# Patient Record
Sex: Female | Born: 1945 | Race: White | Hispanic: No | Marital: Married | Smoking: Never smoker
Health system: Southern US, Community
[De-identification: ages and names within clinical notes are randomized; demographics above are authoritative.]

## PROBLEM LIST (undated history)

## (undated) DIAGNOSIS — I2692 Saddle embolus of pulmonary artery without acute cor pulmonale: Secondary | ICD-10-CM

## (undated) DIAGNOSIS — C50912 Malignant neoplasm of unspecified site of left female breast: Secondary | ICD-10-CM

## (undated) DIAGNOSIS — D649 Anemia, unspecified: Secondary | ICD-10-CM

## (undated) DIAGNOSIS — I10 Essential (primary) hypertension: Secondary | ICD-10-CM

## (undated) DIAGNOSIS — C801 Malignant (primary) neoplasm, unspecified: Secondary | ICD-10-CM

## (undated) HISTORY — DX: Anemia, unspecified: D64.9

## (undated) HISTORY — DX: Malignant (primary) neoplasm, unspecified: C80.1

---
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
## 1972-04-18 HISTORY — PX: TUBAL LIGATION: SHX77

## 2005-04-18 DIAGNOSIS — C801 Malignant (primary) neoplasm, unspecified: Secondary | ICD-10-CM

## 2005-04-18 DIAGNOSIS — C50912 Malignant neoplasm of unspecified site of left female breast: Secondary | ICD-10-CM

## 2005-04-18 HISTORY — DX: Malignant (primary) neoplasm, unspecified: C80.1

## 2005-04-18 HISTORY — DX: Malignant neoplasm of unspecified site of left female breast: C50.912

## 2005-04-18 HISTORY — PX: BREAST BIOPSY: SHX20

## 2005-06-29 HISTORY — PX: BREAST LUMPECTOMY: SHX2

## 2014-07-15 LAB — LIPID PANEL
Cholesterol: 225 mg/dL — AB (ref 0–200)
HDL: 60 mg/dL (ref 35–70)
LDL Cholesterol: 124 mg/dL
Triglycerides: 203 mg/dL — AB (ref 40–160)

## 2014-07-15 LAB — BASIC METABOLIC PANEL
BUN: 17 mg/dL (ref 4–21)
CREATININE: 0.7 mg/dL (ref 0.5–1.1)
GLUCOSE: 90 mg/dL
POTASSIUM: 3.7 mmol/L (ref 3.4–5.3)
SODIUM: 142 mmol/L (ref 137–147)

## 2014-07-15 LAB — CBC AND DIFFERENTIAL
HCT: 44 % (ref 36–46)
HEMOGLOBIN: 14.6 g/dL (ref 12.0–16.0)
Platelets: 256 10*3/uL (ref 150–399)
WBC: 6 10*3/mL

## 2014-07-15 LAB — HEPATIC FUNCTION PANEL
ALT: 18 U/L (ref 7–35)
AST: 20 U/L (ref 13–35)
Alkaline Phosphatase: 100 U/L (ref 25–125)
BILIRUBIN, TOTAL: 0.4 mg/dL

## 2015-07-28 ENCOUNTER — Emergency Department (HOSPITAL_COMMUNITY): Payer: Medicare Other

## 2015-07-28 ENCOUNTER — Encounter (HOSPITAL_COMMUNITY): Payer: Self-pay | Admitting: *Deleted

## 2015-07-28 ENCOUNTER — Inpatient Hospital Stay (HOSPITAL_COMMUNITY): Payer: Medicare Other

## 2015-07-28 ENCOUNTER — Inpatient Hospital Stay (HOSPITAL_COMMUNITY)
Admission: EM | Admit: 2015-07-28 | Discharge: 2015-08-01 | DRG: 175 | Disposition: A | Discharge status: Home / Self Care | Payer: Medicare Other | Attending: Internal Medicine | Admitting: Internal Medicine

## 2015-07-28 DIAGNOSIS — Z9221 Personal history of antineoplastic chemotherapy: Secondary | ICD-10-CM

## 2015-07-28 DIAGNOSIS — I82412 Acute embolism and thrombosis of left femoral vein: Secondary | ICD-10-CM | POA: Diagnosis present

## 2015-07-28 DIAGNOSIS — IMO0002 Reserved for concepts with insufficient information to code with codable children: Secondary | ICD-10-CM

## 2015-07-28 DIAGNOSIS — R079 Chest pain, unspecified: Secondary | ICD-10-CM | POA: Diagnosis not present

## 2015-07-28 DIAGNOSIS — I82442 Acute embolism and thrombosis of left tibial vein: Secondary | ICD-10-CM | POA: Diagnosis present

## 2015-07-28 DIAGNOSIS — Z923 Personal history of irradiation: Secondary | ICD-10-CM

## 2015-07-28 DIAGNOSIS — J9601 Acute respiratory failure with hypoxia: Secondary | ICD-10-CM | POA: Diagnosis present

## 2015-07-28 DIAGNOSIS — R55 Syncope and collapse: Secondary | ICD-10-CM | POA: Diagnosis present

## 2015-07-28 DIAGNOSIS — Z853 Personal history of malignant neoplasm of breast: Secondary | ICD-10-CM

## 2015-07-28 DIAGNOSIS — I749 Embolism and thrombosis of unspecified artery: Secondary | ICD-10-CM | POA: Diagnosis not present

## 2015-07-28 DIAGNOSIS — I10 Essential (primary) hypertension: Secondary | ICD-10-CM | POA: Diagnosis present

## 2015-07-28 DIAGNOSIS — I517 Cardiomegaly: Secondary | ICD-10-CM | POA: Diagnosis present

## 2015-07-28 DIAGNOSIS — I119 Hypertensive heart disease without heart failure: Secondary | ICD-10-CM | POA: Diagnosis present

## 2015-07-28 DIAGNOSIS — I2699 Other pulmonary embolism without acute cor pulmonale: Secondary | ICD-10-CM | POA: Diagnosis present

## 2015-07-28 DIAGNOSIS — R0602 Shortness of breath: Secondary | ICD-10-CM | POA: Diagnosis not present

## 2015-07-28 DIAGNOSIS — I2692 Saddle embolus of pulmonary artery without acute cor pulmonale: Secondary | ICD-10-CM

## 2015-07-28 DIAGNOSIS — I82432 Acute embolism and thrombosis of left popliteal vein: Secondary | ICD-10-CM | POA: Diagnosis present

## 2015-07-28 DIAGNOSIS — I959 Hypotension, unspecified: Secondary | ICD-10-CM | POA: Diagnosis present

## 2015-07-28 HISTORY — DX: Malignant neoplasm of unspecified site of left female breast: C50.912

## 2015-07-28 HISTORY — DX: Saddle embolus of pulmonary artery without acute cor pulmonale: I26.92

## 2015-07-28 HISTORY — DX: Essential (primary) hypertension: I10

## 2015-07-28 LAB — COMPREHENSIVE METABOLIC PANEL
ALK PHOS: 78 U/L (ref 38–126)
ALT: 27 U/L (ref 14–54)
ALT: 37 U/L (ref 14–54)
ANION GAP: 11 (ref 5–15)
ANION GAP: 7 (ref 5–15)
AST: 37 U/L (ref 15–41)
AST: 48 U/L — ABNORMAL HIGH (ref 15–41)
Albumin: 2.7 g/dL — ABNORMAL LOW (ref 3.5–5.0)
Albumin: 2.6 g/dL — ABNORMAL LOW (ref 3.5–5.0)
Alkaline Phosphatase: 74 U/L (ref 38–126)
BILIRUBIN TOTAL: 0.4 mg/dL (ref 0.3–1.2)
BILIRUBIN TOTAL: 0.5 mg/dL (ref 0.3–1.2)
BUN: 10 mg/dL (ref 6–20)
BUN: 9 mg/dL (ref 6–20)
CALCIUM: 8.2 mg/dL — AB (ref 8.9–10.3)
CO2: 22 mmol/L (ref 22–32)
CO2: 22 mmol/L (ref 22–32)
Calcium: 8.1 mg/dL — ABNORMAL LOW (ref 8.9–10.3)
Chloride: 109 mmol/L (ref 101–111)
Chloride: 112 mmol/L — ABNORMAL HIGH (ref 101–111)
Creatinine, Ser: 0.84 mg/dL (ref 0.44–1.00)
Creatinine, Ser: 0.72 mg/dL (ref 0.44–1.00)
GFR calc non Af Amer: >60 mL/min (ref >60)
Glucose, Bld: 101 mg/dL — ABNORMAL HIGH (ref 65–99)
Glucose, Bld: 113 mg/dL — ABNORMAL HIGH (ref 65–99)
POTASSIUM: 3.7 mmol/L (ref 3.5–5.1)
Potassium: 3.9 mmol/L (ref 3.5–5.1)
SODIUM: 141 mmol/L (ref 135–145)
Sodium: 142 mmol/L (ref 135–145)
TOTAL PROTEIN: 5.7 g/dL — AB (ref 6.5–8.1)
TOTAL PROTEIN: 5.4 g/dL — AB (ref 6.5–8.1)

## 2015-07-28 LAB — CBC
HEMATOCRIT: 30.6 % — AB (ref 36.0–46.0)
Hemoglobin: 10 g/dL — ABNORMAL LOW (ref 12.0–15.0)
MCH: 29.9 pg (ref 26.0–34.0)
MCHC: 32.7 g/dL (ref 30.0–36.0)
MCV: 91.6 fL (ref 78.0–100.0)
PLATELETS: 155 10*3/uL (ref 150–400)
RBC: 3.34 MIL/uL — AB (ref 3.87–5.11)
RDW: 12.8 % (ref 11.5–15.5)
WBC: 10.2 10*3/uL (ref 4.0–10.5)

## 2015-07-28 LAB — CBC WITH DIFFERENTIAL/PLATELET
BASOS ABS: 0 10*3/uL (ref 0.0–0.1)
Basophils Absolute: 0 10*3/uL (ref 0.0–0.1)
Basophils Relative: 0 %
Basophils Relative: 0 %
EOS ABS: 0 10*3/uL (ref 0.0–0.7)
EOS PCT: 1 %
Eosinophils Absolute: 0.1 10*3/uL (ref 0.0–0.7)
Eosinophils Relative: 0 %
HEMATOCRIT: 31.1 % — AB (ref 36.0–46.0)
HEMATOCRIT: 29.6 % — AB (ref 36.0–46.0)
HEMOGLOBIN: 9.2 g/dL — AB (ref 12.0–15.0)
Hemoglobin: 9.7 g/dL — ABNORMAL LOW (ref 12.0–15.0)
LYMPHS ABS: 1.4 10*3/uL (ref 0.7–4.0)
LYMPHS PCT: 13 %
Lymphocytes Relative: 20 %
Lymphs Abs: 1.6 10*3/uL (ref 0.7–4.0)
MCH: 28.8 pg (ref 26.0–34.0)
MCH: 28.4 pg (ref 26.0–34.0)
MCHC: 31.2 g/dL (ref 30.0–36.0)
MCHC: 31.1 g/dL (ref 30.0–36.0)
MCV: 92.3 fL (ref 78.0–100.0)
MCV: 91.4 fL (ref 78.0–100.0)
MONO ABS: 0.8 10*3/uL (ref 0.1–1.0)
MONO ABS: 0.4 10*3/uL (ref 0.1–1.0)
MONOS PCT: 6 %
MONOS PCT: 6 %
Neutro Abs: 10 10*3/uL — ABNORMAL HIGH (ref 1.7–7.7)
Neutro Abs: 5.1 10*3/uL (ref 1.7–7.7)
Neutrophils Relative %: 80 %
Neutrophils Relative %: 74 %
PLATELETS: 144 10*3/uL — AB (ref 150–400)
Platelets: 152 10*3/uL (ref 150–400)
RBC: 3.37 MIL/uL — ABNORMAL LOW (ref 3.87–5.11)
RBC: 3.24 MIL/uL — ABNORMAL LOW (ref 3.87–5.11)
RDW: 12.9 % (ref 11.5–15.5)
RDW: 12.8 % (ref 11.5–15.5)
WBC: 12.5 10*3/uL — ABNORMAL HIGH (ref 4.0–10.5)
WBC: 6.9 10*3/uL (ref 4.0–10.5)

## 2015-07-28 LAB — I-STAT CHEM 8, ED
BUN: 13 mg/dL (ref 6–20)
CALCIUM ION: 1.15 mmol/L (ref 1.13–1.30)
CHLORIDE: 106 mmol/L (ref 101–111)
Creatinine, Ser: 0.7 mg/dL (ref 0.44–1.00)
GLUCOSE: 93 mg/dL (ref 65–99)
HCT: 33 % — ABNORMAL LOW (ref 36.0–46.0)
Hemoglobin: 11.2 g/dL — ABNORMAL LOW (ref 12.0–15.0)
Potassium: 3.6 mmol/L (ref 3.5–5.1)
Sodium: 143 mmol/L (ref 135–145)
TCO2: 24 mmol/L (ref 0–100)

## 2015-07-28 LAB — ANTITHROMBIN III: AntiThromb III Func: 82 % (ref 75–120)

## 2015-07-28 LAB — PROTIME-INR
INR: 1.28 (ref 0.00–1.49)
INR: 1.28 (ref 0.00–1.49)
Prothrombin Time: 16.2 seconds — ABNORMAL HIGH (ref 11.6–15.2)
Prothrombin Time: 16.1 seconds — ABNORMAL HIGH (ref 11.6–15.2)

## 2015-07-28 LAB — I-STAT TROPONIN, ED: TROPONIN I, POC: 0.16 ng/mL — AB (ref 0.00–0.08)

## 2015-07-28 LAB — APTT
aPTT: 27 seconds (ref 24–37)
aPTT: 31 seconds (ref 24–37)

## 2015-07-28 LAB — LACTIC ACID, PLASMA: LACTIC ACID, VENOUS: 1.1 mmol/L (ref 0.5–2.0)

## 2015-07-28 MED ORDER — HEPARIN (PORCINE) IN NACL 100-0.45 UNIT/ML-% IJ SOLN
1000.0000 [IU]/h | INTRAMUSCULAR | Status: DC
  Administered 2015-07-28: 1000 [IU]/h via INTRAVENOUS
  Filled 2015-07-28 (×2): qty 250

## 2015-07-28 MED ORDER — PANTOPRAZOLE SODIUM 40 MG PO TBEC
40.0000 mg | DELAYED_RELEASE_TABLET | Freq: Every day | ORAL | Status: DC
  Administered 2015-07-29 – 2015-08-01 (×4): 40 mg via ORAL
  Filled 2015-07-28 (×3): qty 1

## 2015-07-28 MED ORDER — HEPARIN BOLUS VIA INFUSION
4000.0000 [IU] | Freq: Once | INTRAVENOUS | Status: DC
  Filled 2015-07-28: qty 4000

## 2015-07-28 MED ORDER — HEPARIN (PORCINE) IN NACL 100-0.45 UNIT/ML-% IJ SOLN
1100.0000 [IU]/h | INTRAMUSCULAR | Status: DC
  Filled 2015-07-28: qty 250

## 2015-07-28 MED ORDER — ALTEPLASE (PULMONARY EMBOLISM) INFUSION: 100.0000 mg | Freq: Once | INTRAVENOUS | Status: DC

## 2015-07-28 MED ORDER — IOPAMIDOL (ISOVUE-370) INJECTION 76%
INTRAVENOUS | Status: AC
  Administered 2015-07-28: 100 mL
  Filled 2015-07-28: qty 100

## 2015-07-28 MED ORDER — SODIUM CHLORIDE 0.9 % IV SOLN
Freq: Once | INTRAVENOUS | Status: AC
  Administered 2015-07-28: 17:00:00 via INTRAVENOUS

## 2015-07-28 MED ORDER — SODIUM CHLORIDE 0.9 % IV SOLN
INTRAVENOUS | Status: DC
  Administered 2015-07-29 – 2015-07-31 (×4): via INTRAVENOUS

## 2015-07-28 MED ORDER — SODIUM CHLORIDE 0.9 % IV BOLUS (SEPSIS)
1000.0000 mL | Freq: Once | INTRAVENOUS | Status: AC
  Administered 2015-07-28: 1000 mL via INTRAVENOUS

## 2015-07-28 MED ORDER — ALTEPLASE (PULMONARY EMBOLISM) INFUSION
100.0000 mg | Freq: Once | INTRAVENOUS | Status: AC
  Administered 2015-07-28: 100 mg via INTRAVENOUS
  Filled 2015-07-28: qty 100

## 2015-07-28 NOTE — ED Notes (Signed)
Dr. Darl Householder at bedside with patient and family.  Ultrasound at bedside.

## 2015-07-28 NOTE — ED Notes (Signed)
Critical care at bedside with patient and family.

## 2015-07-28 NOTE — Progress Notes (Signed)
Patient arrived on unit from ED.  Transferred to unit bed from stretcher.  Patient calm and pleasant.  No s/s of distress or pain.  Patient alert and oriented x4.  Equal strength in BLE BUE.  Neuro assessment WNL.

## 2015-07-28 NOTE — ED Notes (Signed)
Nurse transported patient to CT  

## 2015-07-28 NOTE — Progress Notes (Signed)
Patients PIV's do not have blood return.  Reported to Georgann Housekeeper, NP.  Stated its O.K. for lab to stick her for blood.

## 2015-07-28 NOTE — ED Notes (Signed)
Additional information patient became dizzy after watering plants fell hit forehead on floor. Hematoma present light pink red. EMS reported last vital signs of BP 90/50, HR 78, RR16, and CBG 133.

## 2015-07-28 NOTE — H&P (Signed)
Name: Peggy Cuevas MRN: [id number] DOB: 05-25-1945    ADMISSION DATE:  07/28/2015 CONSULTATION DATE:  07/28/2015  REFERRING MD :    CHIEF COMPLAINT:  Dizziness  BRIEF PATIENT DESCRIPTION: 70 year old female presented to ED 4/11 after syncopal episode found to have saddle PE. Requiring 3L [place] for sat > 92%. PCCM to admit.  SIGNIFICANT EVENTS  4/11 admit  STUDIES:  CT chest 4/11>Positive  for acute PE with saddle embolus and CT evidence of right heart strain consistent  HISTORY OF PRESENT ILLNESS:  70 year old female with PMH as below, which includes HTN. She does not smoke/drink alcohol/or use any illegal drugs. She travels by car quite frequently for several hours at a time. Approximately one week prior to admission she started having some L leg discomfort and swelling. She wrote this off and did not have time to get it addressed. 4/11 after getting home she sat down in a chair and became dizzy eventually suffering a syncopal episode and falling out of chair, striking R frontal head on marble floor. Husband quickly called 911 and she was transported to ED, where she was initially hypoxic and hypotensive. Which responded well to supplemental O2 and IV fluid. CT angio of the chest demonstrated saddle PE. She was initially started on TPA in ED, however, she improved quickly and TPA was stopped shortly after (about 20 mins). She was admitted to ICU for further monitoring.   PAST MEDICAL HISTORY :   has a past medical history of Hypertension.  has no past surgical history on file. Prior to Admission medications   Not on File   No Known Allergies  FAMILY HISTORY:  family history is not on file. SOCIAL HISTORY:  reports that she has never smoked. She does not have any smokeless tobacco history on file. She reports that she does not drink alcohol or use illicit drugs.  REVIEW OF SYSTEMS:   Bolds are positive  Constitutional: weight loss, gain, night sweats, Fevers, chills, fatigue .    HEENT: headaches, Sore throat, sneezing, nasal congestion, post nasal drip, Difficulty swallowing, Tooth/dental problems, visual complaints visual changes, ear ache CV:  chest pain, radiates: ,Orthopnea, PND, swelling in lower extremities, dizziness, palpitations, syncope.  GI  heartburn, indigestion, abdominal pain, nausea, vomiting, diarrhea, change in bowel habits, loss of appetite, bloody stools.  Resp: cough, productive: , hemoptysis, dyspnea, chest pain, pleuritic.  Skin: rash or itching or icterus GU: dysuria, change in color of urine, urgency or frequency. flank pain, hematuria  MS: L leg pain or swelling. decreased range of motion  Psych: change in mood or affect. depression or anxiety.  Neuro: difficulty with speech, weakness, numbness, ataxia    SUBJECTIVE:   VITAL SIGNS: Temp:  [97.6 F (36.4 C)] 97.6 F (36.4 C) (04/11 1630) Pulse Rate:  [87-96] 96 (04/11 1645) Resp:  [20-22] 20 (04/11 1630) BP: (90-121)/(66-88) 121/82 mmHg (04/11 1645) SpO2:  [85 %-100 %] 100 % (04/11 1645) Weight:  [71.668 kg (158 lb)] 71.668 kg (158 lb) (04/11 1603)  PHYSICAL EXAMINATION: General:  Female of normal body habitus in NAD Neuro:  Alert, oriented, non-focal HEENT:  Moderate hematoma above R eye. PERRL, non-focal  Cardiovascular:  RRR, no MRG Lungs:  Clear bilateral breath sounds, normal effort Abdomen:  Soft, non-tender, non-distended Musculoskeletal:  +1 pitting edema LLE, with erythema on anterior surface Skin:  Grossly intact   Recent Labs Lab 07/28/15 1521 07/28/15 1524  NA 143 142  K 3.6 3.7  CL 106 109  CO2  --  22  BUN 13 10  CREATININE 0.70 0.84  GLUCOSE 93 101*    Recent Labs Lab 07/28/15 1521 07/28/15 1524 07/28/15 1626  HGB 11.2* 9.7* 10.0*  HCT 33.0* 31.1* 30.6*  WBC  --  12.5* 10.2  PLT  --  144* 155   Ct Angio Chest Pe W/cm &/or Wo Cm  07/28/2015  CLINICAL DATA:  70 year old female with syncope. Initial encounter. EXAM: CT ANGIOGRAPHY CHEST WITH  CONTRAST TECHNIQUE: Multidetector CT imaging of the chest was performed using the standard protocol during bolus administration of intravenous contrast. Multiplanar CT image reconstructions and MIPs were obtained to evaluate the vascular anatomy. CONTRAST:  100 mL Isovue 370 COMPARISON:  None available FINDINGS: Good contrast bolus timing in the pulmonary arterial tree. Positive for saddle pulmonary embolus. Moderate to large volume of hilar thrombus bilaterally, slightly greater on the right. Axial images suggest an abnormal RV LV ratio (Series 6, image 289). No pericardial effusion. Superimposed moderate to large gastric hiatal hernia. No pleural effusions. No mediastinal lymphadenopathy. Calcified aortic atherosclerosis. Calcified coronary artery atherosclerosis. No axillary lymphadenopathy. Major airways are patent aside from atelectatic changes. Mild lower lobe ground-glass opacity, favor atelectasis. No consolidation or definite pulmonary infarct at this time. No acute osseous abnormality identified. CT Abdomen and Pelvis from today reported separately. Visualized noncontrast liver, gallbladder, spleen, pancreas, adrenal glands, kidneys, and small bowel are within normal limits. Diverticulosis of the colon. Gastric hiatal hernia as described above. Review of the MIP images confirms the above findings. IMPRESSION: 1. Positive for acute PE with saddle embolus and CT evidence of right heart strain consistent with at least submassive (intermediate risk) PE. The presence of right heart strain has been associated with an increased risk of morbidity and mortality. Please activate Code PE by paging [phone]. Critical Value/emergent results discussed in person with Dr. Shirlyn Goltz at 1605 hours on 07/28/2015. 2. No other acute findings identified in the chest or visualized upper abdomen. Moderate to large gastric hiatal hernia. Electronically Signed   By: Genevie Ann M.D.   On: 07/28/2015 16:46   Dg Chest Port 1  View  07/28/2015  CLINICAL DATA:  70 year old female with syncope. Acute pulmonary embolus. Initial encounter. EXAM: PORTABLE CHEST 1 VIEW COMPARISON:  Chest CTA from today reported separately. FINDINGS: Portable AP semi upright view at 1603 hours. Mild cardiomegaly. Gastric hiatal hernia, better demonstrated by CTA. Other mediastinal contours are within normal limits. Somewhat low lung volumes. No pneumothorax, pulmonary edema, pleural effusion or consolidation. Visualized tracheal air column is within normal limits. IMPRESSION: 1. Acute pulmonary embolus, see chest CTA reported separately. 2. No superimposed acute acute radiographic findings. Electronically Signed   By: Genevie Ann M.D.   On: 07/28/2015 16:52    ASSESSMENT / PLAN:  Acute hypoxemic respiratory failure Pulmonary embolism (saddle) Syncope, suspect secondary to PE Hypotension > improved - Heparin gtt per pharmacy once aPTT at safe level to intitiate.  - Doppler legs to assess DVT - Echocardiogram to assess R heart strain - Patient educated on warfarin vs NOAC, will likely need 6 months anticoagulation.  - Hypercoagulable panel - NS 50 mL/hour - Monitor for signs of bleeding  H/o Hypertension - holding home losartan  Diet: Regular SUP: PO protonix VTE ppx: on full dose anticoagulation Admit: ICU  Georgann Housekeeper, AGACNP-BC [facility] Pulmonology/Critical Care Pager [phone] or [phone]  07/28/2015 5:32 PM  Attending Note:  70 year old female with PMH only of HTN on losartan who had left  leg pain and swelling after a long car ride.  Patient arrived home and sat on a chair then had a syncopal episode, fell and struck her head.  Patient was brought to the ED where she had a CT of the chest that revealed a saddle embolism, was hypoxemic and hypotensive (responded to IVF) and PCCM was consulted for admission.  Decision initially was made to give tPA however after exam, tPA was stopped due to head injury and lack of  refractory hypoxemia or refractory hypotension.  On exam, a 5x5 hematoma on the right aspect of the skin and chest exam is clear.  Patient received a total of 18 mg of tPA before it stopping.  Discussed with pharmacy.  Will check a PTT level now, if <16 will start heparin.  Will admit to the ICU for observation.  Patient given the choice of coumadin or a NOAC and is currently considering it.  The patient is critically ill with multiple organ systems failure and requires high complexity decision making for assessment and support, frequent evaluation and titration of therapies, application of advanced monitoring technologies and extensive interpretation of multiple databases.   Critical Care Time devoted to patient care services described in this note is  35  Minutes. This time reflects time of care of this signee Dr Jennet Maduro. This critical care time does not reflect procedure time, or teaching time or supervisory time of PA/NP/Med student/Med Resident etc but could involve care discussion time.  Rush Farmer, M.D. [facility] Pulmonary/Critical Care Medicine. Pager: [phone]. After hours pager: [phone].

## 2015-07-28 NOTE — ED Notes (Signed)
EKG completed given to EDP.  

## 2015-07-28 NOTE — Progress Notes (Signed)
ANTICOAGULATION CONSULT NOTE - Initial Consult  Pharmacy Consult for Heparin Indication: pulmonary embolus  No Known Allergies  Patient Measurements: Height: 5\' 4"  (162.6 cm) Weight: 158 lb (71.668 kg) IBW/kg (Calculated) : 54.7 Heparin Dosing Weight: 69.4 kg  Vital Signs: BP: 114/88 mmHg (04/11 1530) Pulse Rate: 94 (04/11 1530)  Labs:  Recent Labs  07/28/15 1521 07/28/15 1524  HGB 11.2* 9.7*  HCT 33.0* 31.1*  PLT  --  144*  CREATININE 0.70 0.84    Estimated Creatinine Clearance: 62.2 mL/min (by C-G formula based on Cr of 0.84).   Medical History: Past Medical History  Diagnosis Date  . Hypertension     Assessment: 24-yo female presenting after syncopal event.  Described lower left extremity swelling for one week, then drove from [place].  Patient reports not oral anticoagulant use PTA.  CT reveals massive, saddle PE.  Started tPA 100 mg at 1631, but stopped after 15-20 mins due to hematoma from syncopal fall.  Pharmacy consulted to start heparin.    On Admit: Hgb 9.7, PLT 144, Trop 0.16  Goal of Therapy:  Heparin level 0.3-0.5 units/ml for 24h, then 0.3-0.7 Monitor platelets by anticoagulation protocol: Yes   Plan:  --When aPTT < 80, start Heparin 1000 units/hr --Check anti-Xa level in 8 hours and daily while on heparin --Continue to monitor H&H and platelets  Viann Fish 07/28/2015,4:04 PM

## 2015-07-28 NOTE — ED Provider Notes (Signed)
CSN: [id number]     Arrival date & time 07/28/15  1453 History   First MD Initiated Contact with Patient 07/28/15 1454     Chief Complaint  Patient presents with  . Loss of Consciousness  . Hypotension     (Consider location/radiation/quality/duration/timing/severity/associated sxs/prior Treatment) The history is provided by the patient and the EMS personnel.  Peggy Cuevas is a 70 y.o. female hx of HTN on losartan here with syncope. Patient states that she was driving all around the last week or so. She went up to rocking him then went to [place]. Got home yesterday. Patient notice progressive left leg swelling for the last week. Today she was watering her plants and then sending felt lightheaded and dizzy and then passed out and hit her head on the floor. EMS noticed hematoma on the right forehead. She was also noticed to have thready pulse and was hypotensive to the 60s-70s initially. Given 1500 cc by EMS and BP 90s on arrival. Per EMS she was hypoxic as well.    Level V caveat- condition of patient    Past Medical History  Diagnosis Date  . Hypertension    History reviewed. No pertinent past surgical history. No family history on file. Social History  Substance Use Topics  . Smoking status: Never Smoker   . Smokeless tobacco: None  . Alcohol Use: No   OB History    No data available     Review of Systems  Cardiovascular: Positive for syncope.  Musculoskeletal:       L calf pain   Neurological: Positive for syncope.  All other systems reviewed and are negative.     Allergies  Review of patient's allergies indicates no known allergies.  Home Medications   Prior to Admission medications   Not on File   BP 117/77 mmHg  Pulse 94  Resp 20  Ht 5\' 4"  (1.626 m)  Wt 158 lb (71.668 kg)  BMI 27.11 kg/m2  SpO2 100% Physical Exam  Constitutional: She is oriented to person, place, and time.  Ill appearing, pale   HENT:  Head: Normocephalic.  Mouth/Throat:  Oropharynx is clear and moist.  Forehead hematoma, no obvious bleeding or laceration   Eyes: Conjunctivae are normal. Pupils are equal, round, and reactive to light.  Neck: Normal range of motion. Neck supple.  Cardiovascular: Normal rate, regular rhythm and normal heart sounds.   Pulmonary/Chest: Effort normal and breath sounds normal. No respiratory distress. She has no wheezes. She has no rales.  Abdominal: Soft. Bowel sounds are normal. She exhibits no distension. There is no tenderness. There is no rebound.  Musculoskeletal:  L calf swollen, tender. 2+ pulses all extremities   Neurological: She is alert and oriented to person, place, and time. No cranial nerve deficit. Coordination normal.  Skin: Skin is warm and dry.  Psychiatric: She has a normal mood and affect. Her behavior is normal. Judgment and thought content normal.  Nursing note and vitals reviewed.   ED Course  Procedures (including critical care time)  CRITICAL CARE Performed by: Darl Householder, Lukka Black   Total critical care time: 45 minutes  Critical care time was exclusive of separately billable procedures and treating other patients.  Critical care was necessary to treat or prevent imminent or life-threatening deterioration.  Critical care was time spent personally by me on the following activities: development of treatment plan with patient and/or surrogate as well as nursing, discussions with consultants, evaluation of patient's response to treatment, examination of patient, obtaining  history from patient or surrogate, ordering and performing treatments and interventions, ordering and review of laboratory studies, ordering and review of radiographic studies, pulse oximetry and re-evaluation of patient's condition.     EMERGENCY DEPARTMENT [place] CARDIAC EXAM "Study: Limited Ultrasound of the heart and pericardium"  INDICATIONS:Hypotension Multiple views of the heart and pericardium are obtained with a multi-frequency  probe.  PERFORMED [id number]  IMAGES ARCHIVED?: No  FINDINGS: No pericardial effusion and Normal contractility  LIMITATIONS:  Body habitus  VIEWS USED: Subcostal 4 chamber and Apical 4 chamber   INTERPRETATION: Cardiac activity present  COMMENT:  RV dilated   EMERGENCY DEPARTMENT [place] ABD/AORTA EXAM Study: Limited Ultrasound of the Abdominal Aorta.  INDICATIONS:Hypotension Indication: Multiple views of the abdominal aorta are obtained from the diaphragmatic hiatus to the aortic bifurcation in transverse and sagittal planes with a multi- Frequency probe.  PERFORMED BY: Myself  IMAGES ARCHIVED?: No  FINDINGS: Free fluid absent  LIMITATIONS:  Body habitus  INTERPRETATION:  No abdominal aortic aneurysm  COMMENT:  No obvious AAA    Labs Review Labs Reviewed  CBC WITH DIFFERENTIAL/PLATELET - Abnormal; Notable for the following:    WBC 12.5 (*)    RBC 3.37 (*)    Hemoglobin 9.7 (*)    HCT 31.1 (*)    Platelets 144 (*)    Neutro Abs 10.0 (*)    All other components within normal limits  COMPREHENSIVE METABOLIC PANEL - Abnormal; Notable for the following:    Glucose, Bld 101 (*)    Calcium 8.1 (*)    Total Protein 5.7 (*)    Albumin 2.7 (*)    All other components within normal limits  I-STAT CHEM 8, ED - Abnormal; Notable for the following:    Hemoglobin 11.2 (*)    HCT 33.0 (*)    All other components within normal limits  I-STAT TROPOININ, ED - Abnormal; Notable for the following:    Troponin i, poc 0.16 (*)    All other components within normal limits  APTT  PROTIME-INR  CBC  APTT  CBC  PROTIME-INR    Imaging Review No results found. I have personally reviewed and evaluated these images and lab results as part of my medical decision-making.   EKG Interpretation   Date/Time:  Tuesday July 28 2015 14:53:54 EDT Ventricular Rate:  85 PR Interval:  149 QRS Duration: 98 QT Interval:  457 QTC Calculation: 543 R Axis:   100 Text Interpretation:  Sinus  rhythm Right axis deviation Prolonged QT  interval No previous ECGs available Confirmed by Ariauna Farabee  MD, Wilver Tignor ([id number]) on  07/28/2015 4:28:01 PM      MDM   Final diagnoses:  None   Peggy Cuevas is a 70 y.o. female here with syncope, hypotension, hypoxia. Concerned for large PE vs AAA but has no abdominal tenderness or pulsatile mass. Bedside US showed dilated RV and no obvious AAA. Will get CT head/neck due to fall, CT angio chest and ab/pel to r/o PE or abdominal pathology.   3:40 pm CT showed massive PE with saddle embolus with R heart strain. Trop positive likely from demand ischemia. BP slightly improved to low 100s after 1 L NS. O2 100% on nonrebreather. Still mentating well. CT head showed no bleed. Discussed with Dr. Ashby Dawes from critical care. He recommend TPA. Ordered TPA and heparin, pharmacy to dose. Will admit to critical care. Still protecting airway, doesn't require intubation currently.    Wandra Arthurs, MD 07/28/15 [id number]

## 2015-07-28 NOTE — ED Notes (Signed)
Returned to ED from CT.  ?

## 2015-07-28 NOTE — Progress Notes (Addendum)
[facility] Progress Note Patient Name: Peggy Cuevas DOB: March 22, 1946 MRN: [id number]   Date of Service  07/28/2015  HPI/Events of Note  Discussed case with ED physician, pt admitted with saddle embolism, hypotension responsive to IVF. PACS system down therefore I could not review the films personally, however they were reviewed with radiologist by ED. Pt has progressive hypoxia initially RA>>and is now on 100% NRB.   eICU Interventions  Recommend thrombolytics. Will admit to ICU.         Laverle Hobby 07/28/2015, 4:09 PM

## 2015-07-28 NOTE — ED Notes (Signed)
One week ago developed swelling left lower extremity and drove home today from [place]. When arrived home watered plants and then had a syncope event. EMS called could only obtain a carotid pulse administered 0.9 NS 1500ML upon arrival patient alert no complaints BP systolic [id number] per EMS. Doctor notified and at bedside upon arrival.

## 2015-07-28 NOTE — ED Notes (Signed)
Patient on room air for about 2 minutes, drops down to 89%.  Patient placed on 2L nasal cannula with 95%.

## 2015-07-29 ENCOUNTER — Inpatient Hospital Stay (HOSPITAL_COMMUNITY): Payer: Medicare Other

## 2015-07-29 ENCOUNTER — Encounter (HOSPITAL_COMMUNITY): Payer: Self-pay | Admitting: General Practice

## 2015-07-29 DIAGNOSIS — R0602 Shortness of breath: Secondary | ICD-10-CM

## 2015-07-29 DIAGNOSIS — R079 Chest pain, unspecified: Secondary | ICD-10-CM

## 2015-07-29 LAB — BASIC METABOLIC PANEL
Anion gap: 9 (ref 5–15)
BUN: 8 mg/dL (ref 6–20)
CALCIUM: 8.3 mg/dL — AB (ref 8.9–10.3)
CO2: 23 mmol/L (ref 22–32)
CREATININE: 0.84 mg/dL (ref 0.44–1.00)
Chloride: 112 mmol/L — ABNORMAL HIGH (ref 101–111)
GFR calc Af Amer: >60 mL/min (ref >60)
GLUCOSE: 92 mg/dL (ref 65–99)
Potassium: 3.8 mmol/L (ref 3.5–5.1)
Sodium: 144 mmol/L (ref 135–145)

## 2015-07-29 LAB — LUPUS ANTICOAGULANT PANEL
DRVVT: 40.2 s (ref 0.0–44.0)
PTT LA: 35.9 s (ref 0.0–43.6)

## 2015-07-29 LAB — HEPARIN LEVEL (UNFRACTIONATED)
Heparin Unfractionated: 0.37 IU/mL (ref 0.30–0.70)
Heparin Unfractionated: 0.52 IU/mL (ref 0.30–0.70)

## 2015-07-29 LAB — ECHOCARDIOGRAM COMPLETE
Height: 64 in
Weight: 2528 oz

## 2015-07-29 LAB — MAGNESIUM: Magnesium: 1.9 mg/dL (ref 1.7–2.4)

## 2015-07-29 LAB — BETA-2-GLYCOPROTEIN I ABS, IGG/M/A
Beta-2 Glyco I IgG: <9 GPI IgG units (ref 0–20)
Beta-2-Glycoprotein I IgA: <9 GPI IgA units (ref 0–25)
Beta-2-Glycoprotein I IgM: <9 GPI IgM units (ref 0–32)

## 2015-07-29 LAB — PROTEIN C ACTIVITY: PROTEIN C ACTIVITY: 105 % (ref 73–180)

## 2015-07-29 LAB — CBC
HEMATOCRIT: 30.1 % — AB (ref 36.0–46.0)
Hemoglobin: 9.3 g/dL — ABNORMAL LOW (ref 12.0–15.0)
MCH: 28.4 pg (ref 26.0–34.0)
MCHC: 30.9 g/dL (ref 30.0–36.0)
MCV: 91.8 fL (ref 78.0–100.0)
PLATELETS: 159 10*3/uL (ref 150–400)
RBC: 3.28 MIL/uL — ABNORMAL LOW (ref 3.87–5.11)
RDW: 13 % (ref 11.5–15.5)
WBC: 6 10*3/uL (ref 4.0–10.5)

## 2015-07-29 LAB — PROTEIN S ACTIVITY: PROTEIN S ACTIVITY: 85 % (ref 63–140)

## 2015-07-29 LAB — HOMOCYSTEINE: Homocysteine: 8.3 umol/L (ref 0.0–15.0)

## 2015-07-29 LAB — PROTEIN C, TOTAL: PROTEIN C, TOTAL: 65 % (ref 60–150)

## 2015-07-29 LAB — MRSA PCR SCREENING: MRSA BY PCR: NEGATIVE

## 2015-07-29 LAB — PHOSPHORUS: Phosphorus: 3.1 mg/dL (ref 2.5–4.6)

## 2015-07-29 LAB — PROTEIN S, TOTAL: PROTEIN S AG TOTAL: 148 % (ref 60–150)

## 2015-07-29 MED ORDER — LOSARTAN POTASSIUM 50 MG PO TABS
100.0000 mg | ORAL_TABLET | Freq: Every day | ORAL | Status: DC
  Administered 2015-07-30 – 2015-08-01 (×3): 100 mg via ORAL
  Filled 2015-07-29 (×3): qty 2

## 2015-07-29 MED ORDER — HEPARIN (PORCINE) IN NACL 100-0.45 UNIT/ML-% IJ SOLN
800.0000 [IU]/h | INTRAMUSCULAR | Status: DC
  Administered 2015-07-29: 1000 [IU]/h via INTRAVENOUS
  Filled 2015-07-29: qty 250

## 2015-07-29 NOTE — Progress Notes (Signed)
  Echocardiogram 2D Echocardiogram has been performed.  Peggy Cuevas 07/29/2015, 12:28 PM

## 2015-07-29 NOTE — Progress Notes (Signed)
*  PRELIMINARY RESULTS* Vascular Ultrasound Lower extremity venous duplex has been completed.  Preliminary findings: DVT noted in the Left common femoral, femoral, profunda femoral, popliteal, gastroc, soleal, posterior tibial, and peroneal veins. No DVT RLE.   Landry Mellow, RDMS, RVT  07/29/2015, 1:45 PM

## 2015-07-29 NOTE — Progress Notes (Signed)
ANTICOAGULATION CONSULT NOTE - Follow Up Consult  Pharmacy Consult for Heparin  Indication: pulmonary embolus  No Known Allergies  Patient Measurements: Height: 5\' 4"  (162.6 cm) Weight: 158 lb (71.668 kg) IBW/kg (Calculated) : 54.7  Vital Signs: Temp: 97.6 F (36.4 C) (04/12 0408) Temp Source: Oral (04/12 0408) BP: 114/78 mmHg (04/12 0500) Pulse Rate: 78 (04/12 0500)  Labs:  Recent Labs  07/28/15 1521  07/28/15 1524 07/28/15 1626 07/28/15 1925 07/29/15 0455  HGB 11.2*  --  9.7* 10.0* 9.2* 9.3*  HCT 33.0*  --  31.1* 30.6* 29.6* 30.1*  PLT  --   < > 144* 155 152 159  APTT  --   --   --  27 31  --   LABPROT  --   --   --  16.2* 16.1*  --   INR  --   --   --  1.28 1.28  --   HEPARINUNFRC  --   --   --   --   --  0.37  CREATININE 0.70  --  0.84  --  0.72  --   < > = values in this interval not displayed.  Estimated Creatinine Clearance: 65.3 mL/min (by C-G formula based on Cr of 0.72).  Assessment: Heparin for saddle PE, initial heparin level is therapeutic at 0.37, other labs reviewed.   Goal of Therapy:  Heparin level 0.3-0.7 units/ml Monitor platelets by anticoagulation protocol: Yes   Plan:  -Cont heparin at 1000 units/hr -1200 HL  Narda Bonds 07/29/2015,6:00 AM

## 2015-07-29 NOTE — Progress Notes (Signed)
PULMONARY / CRITICAL CARE MEDICINE   Name: Peggy Cuevas MRN: [id number] DOB: 1945-06-20    ADMISSION DATE:  07/28/2015 CONSULTATION DATE:  07/28/2015  REFERRING MD: EDP  CHIEF COMPLAINT:  Dizziness  HISTORY OF PRESENT ILLNESS:   70 year old female with PMH as below, which includes HTN. She does not smoke/drink alcohol/or use any illegal drugs. She travels by car quite frequently for several hours at a time. Approximately one week prior to admission she started having some L leg discomfort and swelling. She wrote this off and did not have time to get it addressed. 4/11 after getting home she sat down in a chair and became dizzy eventually suffering a syncopal episode and falling out of chair, striking R frontal head on marble floor. Husband quickly called 911 and she was transported to ED, where she was initially hypoxic and hypotensive. Which responded well to supplemental O2 and IV fluid. CT angio of the chest demonstrated saddle PE. She was initially started on TPA in ED, however, she improved quickly and TPA was stopped shortly after (about 20 mins). She was admitted to ICU for further monitoring.   SUBJECTIVE:  Patient reports no complaints this morning. Denies any shortness of breath, chest pain, dizziness. States left leg is still swollen but non-tender.   VITAL SIGNS: BP 113/82 mmHg  Pulse 80  Temp(Src) 97.6 F (36.4 C) (Oral)  Resp 17  Ht 5\' 4"  (1.626 m)  Wt 158 lb (71.668 kg)  BMI 27.11 kg/m2  SpO2 88%  HEMODYNAMICS:    VENTILATOR SETTINGS:    INTAKE / OUTPUT: I/O last 3 completed shifts: In: 1695.8 [I.V.:1695.8] Out: 725 [Urine:725]  PHYSICAL EXAMINATION: General: alert, well-developed, and cooperative to examination Neuro:  AAOx3, no focal deficits HEENT: Moderate hematoma above R eye. PERRLA, EOMI. Cardiovascular:  RRR, no MRG Lungs: CTAB, normal effort Abdomen:  Soft, NT, ND, BS+ Musculoskeletal:  1+ pitting edema LLE Skin: warm,  dry  LABS:  BMET  Recent Labs Lab 07/28/15 1524 07/28/15 1925 07/29/15 0455  NA 142 141 144  K 3.7 3.9 3.8  CL 109 112* 112*  CO2 22 22 23   BUN 10 9 8   CREATININE 0.84 0.72 0.84  GLUCOSE 101* 113* 92    Electrolytes  Recent Labs Lab 07/28/15 1524 07/28/15 1925 07/29/15 0455  CALCIUM 8.1* 8.2* 8.3*  MG  --   --  1.9  PHOS  --   --  3.1    CBC  Recent Labs Lab 07/28/15 1626 07/28/15 1925 07/29/15 0455  WBC 10.2 6.9 6.0  HGB 10.0* 9.2* 9.3*  HCT 30.6* 29.6* 30.1*  PLT 155 152 159    Coag's  Recent Labs Lab 07/28/15 1626 07/28/15 1925  APTT 27 31  INR 1.28 1.28    Sepsis Markers  Recent Labs Lab 07/28/15 1925  LATICACIDVEN 1.1    ABG No results for input(s): PHART, PCO2ART, PO2ART in the last 168 hours.  Liver Enzymes  Recent Labs Lab 07/28/15 1524 07/28/15 1925  AST 37 48*  ALT 27 37  ALKPHOS 74 78  BILITOT 0.4 0.5  ALBUMIN 2.7* 2.6*    Cardiac Enzymes No results for input(s): TROPONINI, PROBNP in the last 168 hours.  Glucose No results for input(s): GLUCAP in the last 168 hours.  Imaging Ct Head Wo Contrast  07/28/2015  CLINICAL DATA:  Recent syncopal episode EXAM: CT HEAD WITHOUT CONTRAST CT CERVICAL SPINE WITHOUT CONTRAST TECHNIQUE: Multidetector CT imaging of the head and cervical spine was performed following the  standard protocol without intravenous contrast. Multiplanar CT image reconstructions of the cervical spine were also generated. COMPARISON:  None. FINDINGS: CT HEAD FINDINGS Bony calvarium is intact. Mild right forehead swelling is seen related to the recent injury. No findings to suggest acute hemorrhage, acute infarction or space-occupying mass lesion are noted. A small calcification in the right caudate nucleus is noted. CT CERVICAL SPINE FINDINGS Seven cervical segments are well visualized vertebral body height is well maintained. Mild osteophytic changes are noted at C4-5, C5-6 and C6-7. The odontoid is within  normal limits. No prevertebral soft tissue swelling is seen. No acute fracture or acute facet abnormality is noted. The surrounding soft tissue structures are within normal limits. IMPRESSION: CT of the head: No acute intracranial abnormality noted. Mild right forehead swelling is seen related to the recent injury. CT of the cervical spine: Mild degenerative change without acute abnormality. Electronically Signed   By: Inez Catalina M.D.   On: 07/28/2015 19:31   Ct Angio Chest Pe W/cm &/or Wo Cm  07/28/2015  CLINICAL DATA:  70 year old female with syncope. Initial encounter. EXAM: CT ANGIOGRAPHY CHEST WITH CONTRAST TECHNIQUE: Multidetector CT imaging of the chest was performed using the standard protocol during bolus administration of intravenous contrast. Multiplanar CT image reconstructions and MIPs were obtained to evaluate the vascular anatomy. CONTRAST:  100 mL Isovue 370 COMPARISON:  None available FINDINGS: Good contrast bolus timing in the pulmonary arterial tree. Positive for saddle pulmonary embolus. Moderate to large volume of hilar thrombus bilaterally, slightly greater on the right. Axial images suggest an abnormal RV LV ratio (Series 6, image 289). No pericardial effusion. Superimposed moderate to large gastric hiatal hernia. No pleural effusions. No mediastinal lymphadenopathy. Calcified aortic atherosclerosis. Calcified coronary artery atherosclerosis. No axillary lymphadenopathy. Major airways are patent aside from atelectatic changes. Mild lower lobe ground-glass opacity, favor atelectasis. No consolidation or definite pulmonary infarct at this time. No acute osseous abnormality identified. CT Abdomen and Pelvis from today reported separately. Visualized noncontrast liver, gallbladder, spleen, pancreas, adrenal glands, kidneys, and small bowel are within normal limits. Diverticulosis of the colon. Gastric hiatal hernia as described above. Review of the MIP images confirms the above findings.  IMPRESSION: 1. Positive for acute PE with saddle embolus and CT evidence of right heart strain consistent with at least submassive (intermediate risk) PE. The presence of right heart strain has been associated with an increased risk of morbidity and mortality. Please activate Code PE by paging [phone]. Critical Value/emergent results discussed in person with Dr. Shirlyn Goltz at 1605 hours on 07/28/2015. 2. No other acute findings identified in the chest or visualized upper abdomen. Moderate to large gastric hiatal hernia. Electronically Signed   By: Genevie Ann M.D.   On: 07/28/2015 16:46   Ct Cervical Spine Wo Contrast  07/28/2015  CLINICAL DATA:  Recent syncopal episode EXAM: CT HEAD WITHOUT CONTRAST CT CERVICAL SPINE WITHOUT CONTRAST TECHNIQUE: Multidetector CT imaging of the head and cervical spine was performed following the standard protocol without intravenous contrast. Multiplanar CT image reconstructions of the cervical spine were also generated. COMPARISON:  None. FINDINGS: CT HEAD FINDINGS Bony calvarium is intact. Mild right forehead swelling is seen related to the recent injury. No findings to suggest acute hemorrhage, acute infarction or space-occupying mass lesion are noted. A small calcification in the right caudate nucleus is noted. CT CERVICAL SPINE FINDINGS Seven cervical segments are well visualized vertebral body height is well maintained. Mild osteophytic changes are noted at C4-5, C5-6 and C6-7. The  odontoid is within normal limits. No prevertebral soft tissue swelling is seen. No acute fracture or acute facet abnormality is noted. The surrounding soft tissue structures are within normal limits. IMPRESSION: CT of the head: No acute intracranial abnormality noted. Mild right forehead swelling is seen related to the recent injury. CT of the cervical spine: Mild degenerative change without acute abnormality. Electronically Signed   By: Inez Catalina M.D.   On: 07/28/2015 19:31   Ct Abdomen  Pelvis W Contrast  07/28/2015  CLINICAL DATA:  Recent syncopal episode and known pulmonary emboli EXAM: CT ABDOMEN AND PELVIS WITH CONTRAST TECHNIQUE: Multidetector CT imaging of the abdomen and pelvis was performed using the standard protocol following bolus administration of intravenous contrast. CONTRAST:  100 mL Isovue 370 COMPARISON:  None. FINDINGS: Bilateral pulmonary emboli as previously described. A large hiatal hernia is noted. The liver, gallbladder, spleen, adrenal glands and pancreas are within normal limits. Kidneys are well visualized bilaterally. No obstructive changes are seen. No renal calculi noted. The bladder is well distended. The appendix is not well visualized although no inflammatory changes to suggest appendicitis are seen. Diverticular change of the colon is noted without evidence of diverticulitis. The uterus and ovaries are within normal limits. The osseous structures show no acute abnormality. IMPRESSION: Hiatal hernia. Large bilateral pulmonary emboli as previously described. No acute abnormality is noted. Electronically Signed   By: Inez Catalina M.D.   On: 07/28/2015 19:38   Dg Chest Port 1 View  07/28/2015  CLINICAL DATA:  70 year old female with syncope. Acute pulmonary embolus. Initial encounter. EXAM: PORTABLE CHEST 1 VIEW COMPARISON:  Chest CTA from today reported separately. FINDINGS: Portable AP semi upright view at 1603 hours. Mild cardiomegaly. Gastric hiatal hernia, better demonstrated by CTA. Other mediastinal contours are within normal limits. Somewhat low lung volumes. No pneumothorax, pulmonary edema, pleural effusion or consolidation. Visualized tracheal air column is within normal limits. IMPRESSION: 1. Acute pulmonary embolus, see chest CTA reported separately. 2. No superimposed acute acute radiographic findings. Electronically Signed   By: Genevie Ann M.D.   On: 07/28/2015 16:52   STUDIES:  4/11 CT Chest >> Positive for acute PE with saddle embolus and CT  evidence of right heart strain consistent with at least submassive (intermediate risk) PE. 4/11 CT Head >> No acute intracranial abnormality noted. Mild right forehead swelling is seen related to the recent injury. 4/11 CT Abdomen/Pelvis >> Hiatal hernia. Large bilateral pulmonary emboli as previously described. No acute abnormality is noted.  CULTURES: None  ANTIBIOTICS: None  SIGNIFICANT EVENTS:  LINES/TUBES: PIV Left Antecubital, Right Forearm 4/11 >>  DISCUSSION: 70 year old female with a history of breast cancer s/p chemo, radiation and lumpectomy presents with submassive saddle PE. Received 1/6 dose of tPA in the ED.   ASSESSMENT / PLAN:  PULMONARY A: Acute hypoxemic respiratory failure Pulmonary embolism (saddle) Syncope, suspect secondary to PE Hypotension > improved P:   - Heparin gtt per pharmacy - Doppler legs to assess DVT - Echocardiogram to assess R heart strain - Patient educated on warfarin vs NOAC, will likely need 6 months anticoagulation.  - Hypercoagulable panel - NS 50 mL/hour - Monitor for signs of bleeding - Stable for transfer to telemetry today  CARDIOVASCULAR A:  Hx of HTN P:  Hold home medications  RENAL A:   No abnormalities P:    GASTROINTESTINAL A:   Ulcer PPx P:   Protonix   HEMATOLOGIC A:   Acute Saddle PE Hx of Breast CA  s/p chemo/radiation P:  Hypercoagulable panel Heparin ggt  INFECTIOUS A:   None P:    ENDOCRINE A:   None  P:     NEUROLOGIC A:   None P:   RASS goal: 0   FAMILY  - Updates: None  - Inter-disciplinary family meet or Palliative Care meeting due by:  day 7   Maryellen Pile, MD IMTS PGY-1 [phone]   Pulmonary and [place] Pager: [phone]  07/29/2015, 8:08 AM

## 2015-07-29 NOTE — Progress Notes (Signed)
ANTICOAGULATION CONSULT NOTE - Follow Up Consult  Pharmacy Consult for Heparin  Indication: pulmonary embolus  No Known Allergies  Patient Measurements: Height: 5\' 4"  (162.6 cm) Weight: 158 lb (71.668 kg) IBW/kg (Calculated) : 54.7  Vital Signs: Temp: 98 F (36.7 C) (04/12 1146) Temp Source: Oral (04/12 0843) BP: 104/72 mmHg (04/12 1200) Pulse Rate: 80 (04/12 1200)  Labs:  Recent Labs  07/28/15 1524 07/28/15 1626 07/28/15 1925 07/29/15 0455 07/29/15 1243  HGB 9.7* 10.0* 9.2* 9.3*  --   HCT 31.1* 30.6* 29.6* 30.1*  --   PLT 144* 155 152 159  --   APTT  --  27 31  --   --   LABPROT  --  16.2* 16.1*  --   --   INR  --  1.28 1.28  --   --   HEPARINUNFRC  --   --   --  0.37 0.52  CREATININE 0.84  --  0.72 0.84  --     Estimated Creatinine Clearance: 62.2 mL/min (by C-G formula based on Cr of 0.84).  Assessment: 13-yo female presenting after syncopal event. Described lower left extremity swelling for one week, then drove from [place]. Patient reports no oral anticoagulant use PTA. CT reveals massive, saddle PE. HL therapeutic x 2  HL 0.52, hgb 9.2, plts 159  Goal of Therapy:  Heparin level 0.3-0.7 units/ml Monitor platelets by anticoagulation protocol: Yes   Plan:  -Cont heparin at 1000 units/hr -Daily HL, CBC -Monitor for S&S of bleed  Angela Burke, PharmD Pharmacy Resident Pager: [phone] 07/29/2015,1:26 PM

## 2015-07-30 DIAGNOSIS — I749 Embolism and thrombosis of unspecified artery: Secondary | ICD-10-CM

## 2015-07-30 DIAGNOSIS — I10 Essential (primary) hypertension: Secondary | ICD-10-CM | POA: Diagnosis present

## 2015-07-30 DIAGNOSIS — R55 Syncope and collapse: Secondary | ICD-10-CM | POA: Diagnosis present

## 2015-07-30 DIAGNOSIS — R0602 Shortness of breath: Secondary | ICD-10-CM

## 2015-07-30 DIAGNOSIS — IMO0002 Reserved for concepts with insufficient information to code with codable children: Secondary | ICD-10-CM | POA: Diagnosis present

## 2015-07-30 LAB — CBC
HEMATOCRIT: 28.4 % — AB (ref 36.0–46.0)
Hemoglobin: 8.8 g/dL — ABNORMAL LOW (ref 12.0–15.0)
MCH: 28.5 pg (ref 26.0–34.0)
MCHC: 31 g/dL (ref 30.0–36.0)
MCV: 91.9 fL (ref 78.0–100.0)
Platelets: 163 10*3/uL (ref 150–400)
RBC: 3.09 MIL/uL — AB (ref 3.87–5.11)
RDW: 13.2 % (ref 11.5–15.5)
WBC: 8.2 10*3/uL (ref 4.0–10.5)

## 2015-07-30 LAB — CARDIOLIPIN ANTIBODIES, IGG, IGM, IGA

## 2015-07-30 LAB — HEPARIN LEVEL (UNFRACTIONATED)
HEPARIN UNFRACTIONATED: 0.3 [IU]/mL (ref 0.30–0.70)
Heparin Unfractionated: 0.82 IU/mL — ABNORMAL HIGH (ref 0.30–0.70)
Heparin Unfractionated: 0.82 IU/mL — ABNORMAL HIGH (ref 0.30–0.70)

## 2015-07-30 MED ORDER — BISACODYL 5 MG PO TBEC
5.0000 mg | DELAYED_RELEASE_TABLET | Freq: Two times a day (BID) | ORAL | Status: DC
  Administered 2015-07-30: 5 mg via ORAL
  Filled 2015-07-30 (×2): qty 1

## 2015-07-30 NOTE — Progress Notes (Signed)
ANTICOAGULATION CONSULT NOTE - Follow Up Consult  Pharmacy Consult for Heparin  Indication: pulmonary embolus  No Known Allergies  Patient Measurements: Height: 5\' 4"  (162.6 cm) Weight: 165 lb (74.844 kg) IBW/kg (Calculated) : 54.7  Vital Signs: Temp: 98.3 F (36.8 C) (04/13 0427) Temp Source: Oral (04/13 0427) BP: 136/80 mmHg (04/13 0427) Pulse Rate: 78 (04/13 0427)  Labs:  Recent Labs  07/28/15 1524 07/28/15 1626 07/28/15 1925  07/29/15 0455 07/29/15 1243 07/30/15 0254 07/30/15 1018  HGB 9.7* 10.0* 9.2*  --  9.3*  --  8.8*  --   HCT 31.1* 30.6* 29.6*  --  30.1*  --  28.4*  --   PLT 144* 155 152  --  159  --  163  --   APTT  --  27 31  --   --   --   --   --   LABPROT  --  16.2* 16.1*  --   --   --   --   --   INR  --  1.28 1.28  --   --   --   --   --   HEPARINUNFRC  --   --   --   < > 0.37 0.52 0.82* 0.82*  CREATININE 0.84  --  0.72  --  0.84  --   --   --   < > = values in this interval not displayed.  Estimated Creatinine Clearance: 63.4 mL/min (by C-G formula based on Cr of 0.84).  Assessment: 9-yo female presenting after syncopal event. Described lower left extremity swelling for one week, then drove from [place]. Patient reports no oral anticoagulant use PTA.   CT reveals massive, saddle PE. Dopplers show extensive DVT in left leg.  HL 0.82 this am (above goal), cbc stable. Hyper coag work up appears negative so far with FFL and prothrombin still ip.  Goal of Therapy:  Heparin level 0.3-0.7 units/ml Monitor platelets by anticoagulation protocol: Yes   Plan:  -Decrease heparin to 700 units/hr>>recheck tonight -Daily HL, CBC -Monitor for S&S of bleed -Follow up transition to oral anticoagulation when stable  Erin Hearing PharmD., BCPS Clinical Pharmacist Pager [phone] 07/30/2015 12:21 PM

## 2015-07-30 NOTE — Progress Notes (Signed)
Pt experiencing ongoing "sharp" LLQ abdominal pain, especially with movement, tender to touch.  She states that she feels relief with flatus, but it has been ongoing, yet unchanged, since yesterday.  Her LBM was 4/12 a.m.  Dr. Sherral Hammers notified via text page and asked about adding simethicone and colace to [facility].

## 2015-07-30 NOTE — Progress Notes (Signed)
ANTICOAGULATION CONSULT NOTE - Follow Up Consult  Pharmacy Consult for heparin Indication: pulmonary embolus  Labs:  Recent Labs  07/28/15 1524 07/28/15 1626 07/28/15 1925 07/29/15 0455 07/29/15 1243 07/30/15 0254  HGB 9.7* 10.0* 9.2* 9.3*  --  8.8*  HCT 31.1* 30.6* 29.6* 30.1*  --  28.4*  PLT 144* 155 152 159  --  163  APTT  --  27 31  --   --   --   LABPROT  --  16.2* 16.1*  --   --   --   INR  --  1.28 1.28  --   --   --   HEPARINUNFRC  --   --   --  0.37 0.52 0.82*  CREATININE 0.84  --  0.72 0.84  --   --      Assessment: 70yo female now supratherapeutic on heparin after two levels at goal though had been trending up; Hgb trending down but no outward signs of bleeding per RN.  Goal of Therapy:  Heparin level 0.3-0.7 units/ml   Plan:  Will decrease heparin gtt by 1-2 units/kg/hr to 900 units/hr and check level in 6hr.  Wynona Neat, PharmD, BCPS  07/30/2015,3:42 AM

## 2015-07-30 NOTE — Progress Notes (Signed)
[facility] TEAM 1 - Stepdown/ICU TEAM Progress Note  Roxi Ernster [id number] DOB: 12/13/1945 DOA: 07/28/2015 PCP: No PCP Per Patient  Admit HPI / Brief Narrative: 70 year old WF  PMHx HTN,She travels by car quite frequently for several hours at a time. Approximately one week prior to admission she started having some L leg discomfort and swelling. She wrote this off and did not have time to get it addressed.   4/11 after getting home she sat down in a chair and became dizzy eventually suffering a syncopal episode and falling out of chair, striking R frontal head on marble floor. Husband quickly called 911 and she was transported to ED, where she was initially hypoxic and hypotensive. Which responded well to supplemental O2 and IV fluid. CT angio of the chest demonstrated saddle PE. She was initially started on TPA in ED, however, she improved quickly and TPA was stopped shortly after (about 20 mins). She was admitted to ICU for further monitoring.   HPI/Subjective: 4/13 A/O 4, NAD. States does not recall passing out while she was doing her gardening. Negative family history of hypercoagulability. States may have had a DVT when she was pregnant with her son was never officially diagnosed. Patient does do a lot of driving back and forth between children's house here [place]. States her PCP has retired and will need a new one.  Assessment/Plan: Pulmonary embolism (saddle)/Acute hypoxemic respiratory failure - Heparin gtt per pharmacy - Doppler legs to assess DVT - Echocardiogram to assess R heart strain - Patient educated on warfarin vs NOAC, will likely need 6 months anticoagulation.  - Hypercoagulable panel - NS 50 mL/hour - Monitor for signs of bleeding - Stable for transfer to telemetry today -Consult to CSW; discuss patient's insurance policies and what deductibles will be for NOAC vs Coumadin. Assist in obtaining new PCP  Syncope and collapse  -Secondary to  PE  HTN -Hold home medications  Ulcer PPx -Protonix   Breast CA s/p chemo/radiation -Hypercoagulable panel pending -Heparin ggt   Code Status: FULL Family Communication: Son present at time of exam Disposition Plan: Will await social workers conversation with patient to determine discharge anticoagulant    Consultants: [facility]  Procedure/Significant Events: 4/11 CT Chest >> Positive for acute PE with saddle embolus and CT evidence of right heart strain consistent with at least submassive (intermediate risk) PE. 4/11 CT Head >> No acute intracranial abnormality noted. Mild right forehead swelling is seen related to the recent injury. 4/11 CT Abdomen/Pelvis >> Hiatal hernia. Large bilateral pulmonary emboli as previously described. No acute abnormality is noted.    Culture NA  Antibiotics: NA  DVT prophylaxis: Heparin drip   Devices NA   LINES / TUBES:  NA    Continuous Infusions: . sodium chloride 50 mL/hr at 07/30/15 0348  . heparin 900 Units/hr (07/30/15 0348)    Objective: VITAL SIGNS: Temp: 98.3 F (36.8 C) (04/13 0427) Temp Source: Oral (04/13 0427) BP: 136/80 mmHg (04/13 0427) Pulse Rate: 78 (04/13 0427) SPO2; FIO2:   Intake/Output Summary (Last 24 hours) at 07/30/15 0839 Last data filed at 07/30/15 0400  Gross per 24 hour  Intake    240 ml  Output    300 ml  Net    -60 ml     Exam: General: A/O 4, NAD, No acute respiratory distress Eyes: Negative headache, negative scleral hemorrhage ENT: Negative Runny nose, negative gingival bleeding, Neck:  Negative scars, masses, torticollis, lymphadenopathy, JVD Lungs: Clear to auscultation bilaterally without  wheezes or crackles Cardiovascular: Regular rate and rhythm without murmur gallop or rub normal S1 and S2 Abdomen:negative abdominal pain, nondistended, positive soft, bowel sounds, no rebound, no ascites, no appreciable mass Extremities: No significant cyanosis, clubbing, or edema  bilateral lower extremities. Patient with pain and palpation upper left calf to lower posterior thigh. Psychiatric:  Negative depression, negative anxiety, negative fatigue, negative mania Neurologic:  Cranial nerves II through XII intact, tongue/uvula midline, all extremities muscle strength 5/5, sensation intact throughout,  negative dysarthria, negative expressive aphasia, negative receptive aphasia.   Data Reviewed: Basic Metabolic Panel:  Recent Labs Lab 07/28/15 1521 07/28/15 1524 07/28/15 1925 07/29/15 0455  NA 143 142 141 144  K 3.6 3.7 3.9 3.8  CL 106 109 112* 112*  CO2  --  22 22 23   GLUCOSE 93 101* 113* 92  BUN 13 10 9 8   CREATININE 0.70 0.84 0.72 0.84  CALCIUM  --  8.1* 8.2* 8.3*  MG  --   --   --  1.9  PHOS  --   --   --  3.1   Liver Function Tests:  Recent Labs Lab 07/28/15 1524 07/28/15 1925  AST 37 48*  ALT 27 37  ALKPHOS 74 78  BILITOT 0.4 0.5  PROT 5.7* 5.4*  ALBUMIN 2.7* 2.6*   No results for input(s): LIPASE, AMYLASE in the last 168 hours. No results for input(s): AMMONIA in the last 168 hours. CBC:  Recent Labs Lab 07/28/15 1524 07/28/15 1626 07/28/15 1925 07/29/15 0455 07/30/15 0254  WBC 12.5* 10.2 6.9 6.0 8.2  NEUTROABS 10.0*  --  5.1  --   --   HGB 9.7* 10.0* 9.2* 9.3* 8.8*  HCT 31.1* 30.6* 29.6* 30.1* 28.4*  MCV 92.3 91.6 91.4 91.8 91.9  PLT 144* 155 152 159 163   Cardiac Enzymes: No results for input(s): CKTOTAL, CKMB, CKMBINDEX, TROPONINI in the last 168 hours. BNP (last 3 results) No results for input(s): BNP in the last 8760 hours.  ProBNP (last 3 results) No results for input(s): PROBNP in the last 8760 hours.  CBG: No results for input(s): GLUCAP in the last 168 hours.  Recent Results (from the past 240 hour(s))  MRSA PCR Screening     Status: None   Collection Time: 07/28/15  6:43 PM  Result Value Ref Range Status   MRSA by PCR NEGATIVE NEGATIVE Final    Comment:        The GeneXpert MRSA Assay (FDA approved for  NASAL specimens only), is one component of a comprehensive MRSA colonization surveillance program. It is not intended to diagnose MRSA infection nor to guide or monitor treatment for MRSA infections.      Studies:  Recent x-ray studies have been reviewed in detail by the Attending Physician  Scheduled Meds:  Scheduled Meds: . losartan  100 mg Oral Daily  . pantoprazole  40 mg Oral Q1200    Time spent on care of this patient: 40 mins   Malyn Aytes, Geraldo Docker , MD  [facility] Office  [phone] Pager [phone]  On-Call/Text Page:      [email or web address]      password [email or web address]  If 7PM-7AM, please contact night-coverage [email or web address] Password [facility] 07/30/2015, 8:39 AM   LOS: 2 days   Care during the described time interval was provided by me .  I have reviewed this patient's available data, including medical history, events of note, physical examination, and all test results as part of my evaluation. I have personally reviewed  and interpreted all radiology studies.   Dia Crawford, MD [phone] Pager

## 2015-07-30 NOTE — Progress Notes (Signed)
ANTICOAGULATION CONSULT NOTE - Follow Up Consult  Pharmacy Consult for Heparin  Indication: pulmonary embolus  No Known Allergies  Patient Measurements: Height: 5\' 4"  (162.6 cm) Weight: 165 lb (74.844 kg) IBW/kg (Calculated) : 54.7  Vital Signs: Temp: 98 F (36.7 C) (04/13 2006) Temp Source: Oral (04/13 2006) BP: 149/85 mmHg (04/13 2006) Pulse Rate: 88 (04/13 2006)  Labs:  Recent Labs  07/28/15 1524 07/28/15 1626 07/28/15 1925 07/29/15 0455  07/30/15 0254 07/30/15 1018 07/30/15 1945  HGB 9.7* 10.0* 9.2* 9.3*  --  8.8*  --   --   HCT 31.1* 30.6* 29.6* 30.1*  --  28.4*  --   --   PLT 144* 155 152 159  --  163  --   --   APTT  --  27 31  --   --   --   --   --   LABPROT  --  16.2* 16.1*  --   --   --   --   --   INR  --  1.28 1.28  --   --   --   --   --   HEPARINUNFRC  --   --   --  0.37  < > 0.82* 0.82* 0.30  CREATININE 0.84  --  0.72 0.84  --   --   --   --   < > = values in this interval not displayed.  Estimated Creatinine Clearance: 63.4 mL/min (by C-G formula based on Cr of 0.84).  Assessment: 33-yo female presenting after syncopal event. Described lower left extremity swelling for one week, then drove from [place]. Patient reports no oral anticoagulant use PTA.   CT reveals massive, saddle PE. Dopplers show extensive DVT in left leg.  HL 0.82 this am (above goal), cbc stable. Hyper coag work up appears negative so far with FFL and prothrombin still ip.  F/u HL is barely therapeutic at 0.3 on heparin 700 units/hr. Nurse reports no issues with infusion or bleeding.  Goal of Therapy:  Heparin level 0.3-0.7 units/ml Monitor platelets by anticoagulation protocol: Yes   Plan:  Increase heparin to 800 units/hr 6h HL Daily HL, CBC Monitor for S&S of bleed Follow up transition to oral anticoagulation when stable  Andrey Cota. Diona Foley, PharmD, [place] Clinical Pharmacist Pager [phone] 07/30/2015 8:19 PM

## 2015-07-31 LAB — CBC
HCT: 28.8 % — ABNORMAL LOW (ref 36.0–46.0)
Hemoglobin: 9.1 g/dL — ABNORMAL LOW (ref 12.0–15.0)
MCH: 28.5 pg (ref 26.0–34.0)
MCHC: 31.6 g/dL (ref 30.0–36.0)
MCV: 90.3 fL (ref 78.0–100.0)
PLATELETS: 208 10*3/uL (ref 150–400)
RBC: 3.19 MIL/uL — ABNORMAL LOW (ref 3.87–5.11)
RDW: 12.8 % (ref 11.5–15.5)
WBC: 9.5 10*3/uL (ref 4.0–10.5)

## 2015-07-31 LAB — HEPARIN LEVEL (UNFRACTIONATED)
Heparin Unfractionated: 0.41 IU/mL (ref 0.30–0.70)
Heparin Unfractionated: 0.3 IU/mL (ref 0.30–0.70)

## 2015-07-31 MED ORDER — APIXABAN 5 MG PO TABS: 5.0000 mg | ORAL_TABLET | Freq: Two times a day (BID) | ORAL | Status: DC

## 2015-07-31 MED ORDER — APIXABAN 5 MG PO TABS
10.0000 mg | ORAL_TABLET | Freq: Two times a day (BID) | ORAL | Status: DC
  Administered 2015-07-31 – 2015-08-01 (×2): 10 mg via ORAL
  Filled 2015-07-31 (×2): qty 2

## 2015-07-31 NOTE — Discharge Instructions (Signed)
Information on my medicine - ELIQUIS (apixaban)  This medication education was reviewed with me or my healthcare representative as part of my discharge preparation.  The pharmacist that spoke with me during my hospital stay was:  Wayland Salinas, [facility]  Why was Eliquis prescribed for you? Eliquis was prescribed to treat blood clots that may have been found in the veins of your legs (deep vein thrombosis) or in your lungs (pulmonary embolism) and to reduce the risk of them occurring again.  What do You need to know about Eliquis ? The starting dose is 10 mg (two 5 mg tablets) taken TWICE daily for the FIRST SEVEN (7) DAYS, then on (enter date)  08/07/2015 in the evening  the dose is reduced to ONE 5 mg tablet taken TWICE daily.  Eliquis may be taken with or without food.   Try to take the dose about the same time in the morning and in the evening. If you have difficulty swallowing the tablet whole please discuss with your pharmacist how to take the medication safely.  Take Eliquis exactly as prescribed and DO NOT stop taking Eliquis without talking to the doctor who prescribed the medication.  Stopping may increase your risk of developing a new blood clot.  Refill your prescription before you run out.  After discharge, you should have regular check-up appointments with your healthcare provider that is prescribing your Eliquis.    What do you do if you miss a dose? If a dose of ELIQUIS is not taken at the scheduled time, take it as soon as possible on the same day and twice-daily administration should be resumed. The dose should not be doubled to make up for a missed dose.  Important Safety Information A possible side effect of Eliquis is bleeding. You should call your healthcare provider right away if you experience any of the following: ? Bleeding from an injury or your nose that does not stop. ? Unusual colored urine (red or dark brown) or unusual colored stools (red or  black). ? Unusual bruising for unknown reasons. ? A serious fall or if you hit your head (even if there is no bleeding).  Some medicines may interact with Eliquis and might increase your risk of bleeding or clotting while on Eliquis. To help avoid this, consult your healthcare provider or pharmacist prior to using any new prescription or non-prescription medications, including herbals, vitamins, non-steroidal anti-inflammatory drugs (NSAIDs) and supplements.  This website has more information on Eliquis (apixaban): http://www.eliquis.com/eliquis/home

## 2015-07-31 NOTE — Progress Notes (Signed)
ANTICOAGULATION CONSULT NOTE - Follow Up Consult  Pharmacy Consult for Heparin  Indication: pulmonary embolus  No Known Allergies  Patient Measurements: Height: 5\' 4"  (162.6 cm) Weight: 165 lb (74.844 kg) IBW/kg (Calculated) : 54.7  Vital Signs: Temp: 98 F (36.7 C) (04/13 2006) Temp Source: Oral (04/13 2006) BP: 149/85 mmHg (04/13 2006) Pulse Rate: 88 (04/13 2006)  Labs:  Recent Labs  07/28/15 1524 07/28/15 1626 07/28/15 1925 07/29/15 0455  07/30/15 0254 07/30/15 1018 07/30/15 1945 07/31/15 0223  HGB 9.7* 10.0* 9.2* 9.3*  --  8.8*  --   --  9.1*  HCT 31.1* 30.6* 29.6* 30.1*  --  28.4*  --   --  28.8*  PLT 144* 155 152 159  --  163  --   --  208  APTT  --  27 31  --   --   --   --   --   --   LABPROT  --  16.2* 16.1*  --   --   --   --   --   --   INR  --  1.28 1.28  --   --   --   --   --   --   HEPARINUNFRC  --   --   --  0.37  < > 0.82* 0.82* 0.30 0.41  CREATININE 0.84  --  0.72 0.84  --   --   --   --   --   < > = values in this interval not displayed.  Estimated Creatinine Clearance: 63.4 mL/min (by C-G formula based on Cr of 0.84).  Assessment: 89-yo female presenting after syncopal event. Described lower left extremity swelling for one week, then drove from [place]. Patient reports no oral anticoagulant use PTA.   CT reveals massive, saddle PE. Dopplers show extensive DVT in left leg.  HL 0.41 on heparin gtt at 800 units/hr. No issues with heparin per RN. Most recent CBC stable.    Goal of Therapy:  Heparin level 0.3-0.7 units/ml Monitor platelets by anticoagulation protocol: Yes   Plan:  1. Continue heparin at 800 units/hr 2. Confirmatory HL in 8 hours  3. Daily HL, CBC 4. Monitor for S&S of bleed 5. Follow up transition to oral anticoagulation when stable  Vincenza Hews, PharmD, BCPS 07/31/2015, 3:26 AM Pager: [phone]

## 2015-07-31 NOTE — Progress Notes (Signed)
ANTICOAGULATION CONSULT NOTE - Follow Up Consult  Pharmacy Consult for Heparin>>>Eliquis Indication: pulmonary embolus/DVT  No Known Allergies  Patient Measurements: Height: 5\' 4"  (162.6 cm) Weight: 164 lb 12.8 oz (74.753 kg) IBW/kg (Calculated) : 54.7  Vital Signs: Temp: 97.8 F (36.6 C) (04/14 1427) Temp Source: Oral (04/14 1427) BP: 143/92 mmHg (04/14 1427) Pulse Rate: 80 (04/14 1427)  Labs:  Recent Labs  07/28/15 1925 07/29/15 0455  07/30/15 0254  07/30/15 1945 07/31/15 0223 07/31/15 1115  HGB 9.2* 9.3*  --  8.8*  --   --  9.1*  --   HCT 29.6* 30.1*  --  28.4*  --   --  28.8*  --   PLT 152 159  --  163  --   --  208  --   APTT 31  --   --   --   --   --   --   --   LABPROT 16.1*  --   --   --   --   --   --   --   INR 1.28  --   --   --   --   --   --   --   HEPARINUNFRC  --  0.37  < > 0.82*  < > 0.30 0.41 0.30  CREATININE 0.72 0.84  --   --   --   --   --   --   < > = values in this interval not displayed.  Estimated Creatinine Clearance: 63.4 mL/min (by C-G formula based on Cr of 0.84).  Assessment: 27-yo female presenting after syncopal event. Described lower left extremity swelling for one week, then drove from [place]. Patient reports no oral anticoagulant use PTA.   CT reveals massive, saddle PE. Dopplers show extensive DVT in left leg.   Goal of Therapy:  Heparin level 0.3-0.7 units/ml Monitor platelets by anticoagulation protocol: Yes   Plan:  D/c IV heparin and change to Eliquis 10mg  BID x 7d starting now Followed by 5mg  BID   Ileane Sando S. Alford Highland, PharmD, [facility] Clinical Staff Pharmacist Pager [phone]   07/31/2015 5:39 PM

## 2015-07-31 NOTE — Care Management Important Message (Signed)
Important Message  Patient Details  Name: Peggy Cuevas MRN: [id number] Date of Birth: Nov 26, 1945   Medicare Important Message Given:  Yes    [name] 07/31/2015, 11:09 AM

## 2015-07-31 NOTE — Care Management Note (Signed)
Case Management Note Marvetta Gibbons RN, BSN Unit 2W-Case Manager [phone]  Patient Details  Name: Peggy Cuevas MRN: [id number] Date of Birth: [birth year]  Subjective/Objective:    Pt admitted with PE/DVT                Action/Plan: PTA pt lived at home with spouse- referral received for NOAC and PCP needs- insurance check completed on both Xarelto and Eliquis- S/W AARON @ [facility] # [phone]   XARELTO 20 MG DAILY FOR 30  COVER- YES  CO-PAY- $ 47.00  TIER- 2 DRUG  PRIOR APPROVAL -NO  [facility]   ELIQUIS 2.5 MG BID FOR 30  RUG  PRIOR APPROVAL -NO   COVER- YES  CO-PAY - $ 47.00  TIER- 2 DRUG  PRIOR APPROVAL -NO  PH[facility]AC[facility]    Per Pharmacy- Erin Hearing) who spoke with pt - pt would like to use Eliquis- have spoken with pt at bedside- she is aware of her copay cost, pt given 30 day free card to use at discharge- also discussed PCP needs- [facility] # given to pt for physician referral assistance- pt also states that she plans to establish with the [facility]- and has their number to call to make an appointment- she also is thinking of getting an appointment with [facility]- and will do this most likely with the [facility] location. No further CM needs at this time. CM to follow.   Expected Discharge Date:                  Expected Discharge Plan:  Home/Self Care  In-House Referral:     Discharge planning Services  CM Consult, Medication Assistance  Post Acute Care Choice:    Choice offered to:     DME Arranged:    DME Agency:     HH Arranged:    HH Agency:     Status of Service:  In process, will continue to follow  Medicare Important Message Given:  Yes Date Medicare IM Given:    Medicare IM give by:    Date Additional Medicare IM Given:    Additional Medicare Important Message give by:     If discussed at [place] of Stay Meetings, dates discussed:    Additional Comments:  Dawayne Patricia, RN 07/31/2015,  2:27 PM

## 2015-07-31 NOTE — Progress Notes (Signed)
ANTICOAGULATION CONSULT NOTE - Follow Up Consult  Pharmacy Consult for Heparin  Indication: pulmonary embolus/DVT  No Known Allergies  Patient Measurements: Height: 5\' 4"  (162.6 cm) Weight: 164 lb 12.8 oz (74.753 kg) IBW/kg (Calculated) : 54.7  Vital Signs: Temp: 98.1 F (36.7 C) (04/14 0620) Temp Source: Oral (04/14 0620) BP: 140/86 mmHg (04/14 0620) Pulse Rate: 84 (04/14 0620)  Labs:  Recent Labs  07/28/15 1524 07/28/15 1626 07/28/15 1925 07/29/15 0455  07/30/15 0254  07/30/15 1945 07/31/15 0223 07/31/15 1115  HGB 9.7* 10.0* 9.2* 9.3*  --  8.8*  --   --  9.1*  --   HCT 31.1* 30.6* 29.6* 30.1*  --  28.4*  --   --  28.8*  --   PLT 144* 155 152 159  --  163  --   --  208  --   APTT  --  27 31  --   --   --   --   --   --   --   LABPROT  --  16.2* 16.1*  --   --   --   --   --   --   --   INR  --  1.28 1.28  --   --   --   --   --   --   --   HEPARINUNFRC  --   --   --  0.37  < > 0.82*  < > 0.30 0.41 0.30  CREATININE 0.84  --  0.72 0.84  --   --   --   --   --   --   < > = values in this interval not displayed.  Estimated Creatinine Clearance: 63.4 mL/min (by C-G formula based on Cr of 0.84).  Assessment: 87-yo female presenting after syncopal event. Described lower left extremity swelling for one week, then drove from [place]. Patient reports no oral anticoagulant use PTA.   CT reveals massive, saddle PE. Dopplers show extensive DVT in left leg.  HL 0.3 on heparin gtt at 800 units/hr. No issues with heparin per RN. Most recent CBC stable.   I had a long discussion about anticoagulant options with patient this morning differentiating between the DOACs and warfarin. After much discussion patient said she would prefer apixaban if it were up to her. Her copay is $47 regardless of which DOAC is chosen. Dosing would be apixaban 10mg  bid x 7days then 5mg  bid. -Consideration for 2.5mg  bid after 6 months of therapy   Goal of Therapy:  Heparin level 0.3-0.7  units/ml Monitor platelets by anticoagulation protocol: Yes   Plan:  1. Continue heparin at 800 units/hr 2. Daily HL, CBC 3. Monitor for S&S of bleed 4. Follow up transition to oral anticoagulation when stable  Erin Hearing PharmD., BCPS Clinical Pharmacist Pager [phone] 07/31/2015 2:02 PM

## 2015-07-31 NOTE — Progress Notes (Addendum)
[facility] TEAM 1 - Stepdown/ICU TEAM Progress Note  Peggy Cuevas [id number] DOB: 11/07/1945 DOA: 07/28/2015 PCP: No PCP Per Patient  Admit HPI / Brief Narrative: 70 year old WF  PMHx HTN,She travels by car quite frequently for several hours at a time. Approximately one week prior to admission she started having some L leg discomfort and swelling. She wrote this off and did not have time to get it addressed.   4/11 after getting home she sat down in a chair and became dizzy eventually suffering a syncopal episode and falling out of chair, striking R frontal head on marble floor. Husband quickly called 911 and she was transported to ED, where she was initially hypoxic and hypotensive. Which responded well to supplemental O2 and IV fluid. CT angio of the chest demonstrated saddle PE. She was initially started on TPA in ED, however, she improved quickly and TPA was stopped shortly after (about 20 mins). She was admitted to ICU for further monitoring.   HPI/Subjective: 4/14 A/O 4, NAD. Sitting in chair comfortably, negative SOB. CSW came by and provided patient with information on obtaining PCP.    Assessment/Plan: Pulmonary embolism (saddle)/Acute hypoxemic respiratory failure - Heparin gtt per pharmacy - Doppler legs positive LLE DVT see results below - Echocardiogram; mild diastolic dysfunction see results below - Patient educated by pharmacy on choices between warfarin vs NOAC. Patient has chosen Eliquis. Start Eliquis today will likely need 6 months anticoagulation.  - Hypercoagulable panel pending - NS 50 mL/hour - Monitor for signs of bleeding -Obtain Ambulatory SPO2 in the A.m.  Syncope and collapse  -Secondary to PE  HTN -Hold home medications  Ulcer PPx -Protonix   Breast CA s/p chemo/radiation -Hypercoagulable panel pending -Heparin ggt   Code Status: FULL Family Communication: None present at time of exam Disposition Plan: Will await social workers  conversation with patient to determine discharge anticoagulant    Consultants: [facility]  Procedure/Significant Events: 4/11 CT Chest >> Positive for acute PE with saddle embolus and CT evidence of right heart strain consistent with at least submassive (intermediate risk) PE. 4/11 CT Head >> No acute intracranial abnormality noted. Mild right forehead swelling is seen related to the recent injury. 4/11 CT Abdomen/Pelvis >> Hiatal hernia. Large bilateral pulmonary emboli as previously described. No acute abnormality is noted. 4/12 echocardiogram;Left ventricle: mild LVH. -LVEF= 50% to 55%.-(grade 1 diastolic dysfunction). 4/12 bilateral LE Doppler;- DVT noted in the Left common femoral, femoral, profunda femoral, popliteal, gastroc, soleal, posterior tibial, and peroneal veins.-Negative DVT RLE.   Culture NA  Antibiotics: NA  DVT prophylaxis: Heparin drip   Devices NA   LINES / TUBES:  NA    Continuous Infusions: . sodium chloride 50 mL/hr at 07/31/15 1617  . heparin 800 Units/hr (07/30/15 2027)    Objective: VITAL SIGNS: Temp: 97.8 F (36.6 C) (04/14 1427) Temp Source: Oral (04/14 1427) BP: 143/92 mmHg (04/14 1427) Pulse Rate: 80 (04/14 1427) SPO2; FIO2:   Intake/Output Summary (Last 24 hours) at 07/31/15 1730 Last data filed at 07/31/15 1431  Gross per 24 hour  Intake    840 ml  Output   2325 ml  Net  -1485 ml     Exam: General: A/O 4, NAD, No acute respiratory distress Eyes: Negative headache, negative scleral hemorrhage ENT: Negative Runny nose, negative gingival bleeding, Neck:  Negative scars, masses, torticollis, lymphadenopathy, JVD Lungs: Clear to auscultation bilaterally without wheezes or crackles Cardiovascular: Regular rate and rhythm without murmur gallop or rub normal S1 and  S2 Abdomen:negative abdominal pain, nondistended, positive soft, bowel sounds, no rebound, no ascites, no appreciable mass Extremities: No significant cyanosis,  clubbing, or edema bilateral lower extremities. Patient with pain and palpation upper left calf to lower posterior thigh. Psychiatric:  Negative depression, negative anxiety, negative fatigue, negative mania Neurologic:  Cranial nerves II through XII intact, tongue/uvula midline, all extremities muscle strength 5/5, sensation intact throughout,  negative dysarthria, negative expressive aphasia, negative receptive aphasia.   Data Reviewed: Basic Metabolic Panel:  Recent Labs Lab 07/28/15 1521 07/28/15 1524 07/28/15 1925 07/29/15 0455  NA 143 142 141 144  K 3.6 3.7 3.9 3.8  CL 106 109 112* 112*  CO2  --  22 22 23   GLUCOSE 93 101* 113* 92  BUN 13 10 9 8   CREATININE 0.70 0.84 0.72 0.84  CALCIUM  --  8.1* 8.2* 8.3*  MG  --   --   --  1.9  PHOS  --   --   --  3.1   Liver Function Tests:  Recent Labs Lab 07/28/15 1524 07/28/15 1925  AST 37 48*  ALT 27 37  ALKPHOS 74 78  BILITOT 0.4 0.5  PROT 5.7* 5.4*  ALBUMIN 2.7* 2.6*   No results for input(s): LIPASE, AMYLASE in the last 168 hours. No results for input(s): AMMONIA in the last 168 hours. CBC:  Recent Labs Lab 07/28/15 1524 07/28/15 1626 07/28/15 1925 07/29/15 0455 07/30/15 0254 07/31/15 0223  WBC 12.5* 10.2 6.9 6.0 8.2 9.5  NEUTROABS 10.0*  --  5.1  --   --   --   HGB 9.7* 10.0* 9.2* 9.3* 8.8* 9.1*  HCT 31.1* 30.6* 29.6* 30.1* 28.4* 28.8*  MCV 92.3 91.6 91.4 91.8 91.9 90.3  PLT 144* 155 152 159 163 208   Cardiac Enzymes: No results for input(s): CKTOTAL, CKMB, CKMBINDEX, TROPONINI in the last 168 hours. BNP (last 3 results) No results for input(s): BNP in the last 8760 hours.  ProBNP (last 3 results) No results for input(s): PROBNP in the last 8760 hours.  CBG: No results for input(s): GLUCAP in the last 168 hours.  Recent Results (from the past 240 hour(s))  MRSA PCR Screening     Status: None   Collection Time: 07/28/15  6:43 PM  Result Value Ref Range Status   MRSA by PCR NEGATIVE NEGATIVE Final      Comment:        The GeneXpert MRSA Assay (FDA approved for NASAL specimens only), is one component of a comprehensive MRSA colonization surveillance program. It is not intended to diagnose MRSA infection nor to guide or monitor treatment for MRSA infections.      Studies:  Recent x-ray studies have been reviewed in detail by the Attending Physician  Scheduled Meds:  Scheduled Meds: . bisacodyl  5 mg Oral BID  . losartan  100 mg Oral Daily  . pantoprazole  40 mg Oral Q1200    Time spent on care of this patient: 40 mins   WOODS, Geraldo Docker , MD  [facility] Office  [phone] Pager [phone]  On-Call/Text Page:      [email or web address]      password [email or web address]  If 7PM-7AM, please contact night-coverage [email or web address] Password [email or web address] 07/31/2015, 5:30 PM   LOS: 3 days   Care during the described time interval was provided by me .  I have reviewed this patient's available data, including medical history, events of note, physical examination, and all test results as part of my evaluation. I  have personally reviewed and interpreted all radiology studies.   Dia Crawford, MD [phone] Pager

## 2015-07-31 NOTE — Progress Notes (Addendum)
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

## 2015-08-01 DIAGNOSIS — I2699 Other pulmonary embolism without acute cor pulmonale: Secondary | ICD-10-CM

## 2015-08-01 MED ORDER — APIXABAN 5 MG PO TABS
5.0000 mg | ORAL_TABLET | Freq: Two times a day (BID) | ORAL | Status: DC
Start: 2015-08-07 — End: 2015-08-12

## 2015-08-01 MED ORDER — APIXABAN 5 MG PO TABS: 10.0000 mg | ORAL_TABLET | Freq: Two times a day (BID) | ORAL | Status: DC

## 2015-08-01 MED ORDER — PANTOPRAZOLE SODIUM 40 MG PO TBEC: 40.0000 mg | DELAYED_RELEASE_TABLET | Freq: Every day | ORAL | Status: DC

## 2015-08-01 NOTE — Progress Notes (Signed)
CM met with pt who has her Eliquis from previous CM; does not meet criteria for home O2; and declines all [facility] services.  Pt states she lives with husband and they are walkers.  Pt states she does not need a rolling walker.  HEALTH CONNECT NUMBER placed on AVS for pt to secure a PCP.  No other CM needs were communicated.

## 2015-08-01 NOTE — Discharge Summary (Signed)
Physician Discharge Summary  Peggy Cuevas [id number] DOB: 14-Oct-1945 DOA: 07/28/2015  PCP: No PCP Per Patient  Admit date: 07/28/2015 Discharge date: 08/01/2015  Time spent: 35 minutes  Recommendations for Outpatient Follow-up: Pulmonary embolism (saddle)/Acute hypoxemic respiratory failure - Heparin gtt per pharmacy - Doppler legs positive LLE DVT see results below - Echocardiogram; mild diastolic dysfunction see results below - Patient educated by pharmacy on choices between warfarin vs NOAC. Patient has chosen Eliquis. Start Eliquis today will likely need 6 months anticoagulation.  - Hypercoagulable panel; final results of a number of the tests still pending. Slight have resulted negative. Will need to follow-up with her new PCP and review all results.  -Obtain Ambulatory SPO2; does not meet criteria for home O2  Syncope and collapse  -Secondary to PE  HTN -Losartan 100 mg daily   Ulcer PPx -Protonix 40 mg daily  Breast CA s/p chemo/radiation -Hypercoagulable panel; see PE     Discharge Diagnoses:  Active Problems:   PE (pulmonary embolism)   Saddle embolus (HCC)   SOB (shortness of breath)   Syncope and collapse   Essential hypertension   Discharge Condition: Stable  Diet recommendation: Regular  Filed Weights   07/30/15 0427 07/31/15 0620 08/01/15 0304  Weight: 74.844 kg (165 lb) 74.753 kg (164 lb 12.8 oz) 73.936 kg (163 lb)    History of present illness:  70 year old WF PMHx HTN,She travels by car quite frequently for several hours at a time. Approximately one week prior to admission she started having some L leg discomfort and swelling. She wrote this off and did not have time to get it addressed.   4/11 after getting home she sat down in a chair and became dizzy eventually suffering a syncopal episode and falling out of chair, striking R frontal head on marble floor. Husband quickly called 911 and she was transported to ED, where she was initially  hypoxic and hypotensive. Which responded well to supplemental O2 and IV fluid. CT angio of the chest demonstrated saddle PE. She was initially started on TPA in ED, however, she improved quickly and TPA was stopped shortly after (about 20 mins). She was admitted to ICU for further monitoring. During his hospitalization patient was found to have LLE DVT multiple vessels, as well as saddle PE. Patient does significant amount of long-distance driving to see her family which is most likely the cause of her clots. Patient initially was treated with heparin and now has been transitioned to Eliquis. Also during this hospitalization patient was found to not have a PCP secondary to recent move, patient was seen by NCM Alease Medina counseled in depth patient on the need for obtaining PCP to monitor her clots. Patient was provided information to obtain PCP as well as discount cards perform required medication (Eliquis).   Consultants: [facility]  Procedure/Significant Events: 4/11 CT Chest >> Positive for acute PE with saddle embolus and CT evidence of right heart strain consistent with at least submassive (intermediate risk) PE. 4/11 CT Head >> No acute intracranial abnormality noted. Mild right forehead swelling is seen related to the recent injury. 4/11 CT Abdomen/Pelvis >> Hiatal hernia. Large bilateral pulmonary emboli as previously described. No acute abnormality is noted. 4/12 echocardiogram;Left ventricle: mild LVH. -LVEF= 50% to 55%.-(grade 1 diastolic dysfunction). 4/12 bilateral LE Doppler;- DVT noted in the Left common femoral, femoral, profunda femoral, popliteal, gastroc, soleal, posterior tibial, and peroneal veins.-Negative DVT RLE.  DVT/PE treatment: Eliquis     Discharge Exam: Filed Vitals:  07/31/15 1427 07/31/15 1911 08/01/15 0304 08/01/15 0513  BP: 143/92 136/80  124/58  Pulse: 80 89  83  Temp: 97.8 F (36.6 C) 98.7 F (37.1 C)  98.9 F (37.2 C)  TempSrc: Oral Oral  Oral   Resp: 20 20  20   Height:      Weight:   73.936 kg (163 lb)   SpO2: 95% 92%  92%    General: A/O 4, NAD, No acute respiratory distress Eyes: Negative headache, negative scleral hemorrhage ENT: Negative Runny nose, negative gingival bleeding, Neck: Negative scars, masses, torticollis, lymphadenopathy, JVD Lungs: Clear to auscultation bilaterally without wheezes or crackles Cardiovascular: Regular rate and rhythm without murmur gallop or rub normal S1 and S2 Abdomen:negative abdominal pain, nondistended, positive soft, bowel sounds, no rebound, no ascites, no appreciable mass Extremities: No significant cyanosis, clubbing, or edema bilateral lower extremities. Patient with pain and palpation upper left calf to lower posterior thigh.   Discharge Instructions     Medication List    STOP taking these medications        doxycycline 20 MG tablet  Commonly known as:  PERIOSTAT      TAKE these medications        apixaban 5 MG Tabs tablet  Commonly known as:  ELIQUIS  Take 2 tablets (10 mg total) by mouth 2 (two) times daily.     apixaban 5 MG Tabs tablet  Commonly known as:  ELIQUIS  Take 1 tablet (5 mg total) by mouth 2 (two) times daily.  Start taking on:  08/07/2015     CALCIUM PO  Take 1 tablet by mouth daily.     losartan 100 MG tablet  Commonly known as:  COZAAR  Take 100 mg by mouth daily.     pantoprazole 40 MG tablet  Commonly known as:  PROTONIX  Take 1 tablet (40 mg total) by mouth daily at 12 noon.     VITAMIN D-3 PO  Take 1 tablet by mouth daily.       No Known Allergies Follow-up Information    Follow up with [facility].   Why:  PLEASE CALL THIS NUMBER, FOLLOW THE PROMPTS TO SECURE A PRIMARY CARE PHYSICIAN   Contact information:   [phone]       The results of significant diagnostics from this hospitalization (including imaging, microbiology, ancillary and laboratory) are listed below for reference.    Significant Diagnostic  Studies: Ct Head Wo Contrast  07/28/2015  CLINICAL DATA:  Recent syncopal episode EXAM: CT HEAD WITHOUT CONTRAST CT CERVICAL SPINE WITHOUT CONTRAST TECHNIQUE: Multidetector CT imaging of the head and cervical spine was performed following the standard protocol without intravenous contrast. Multiplanar CT image reconstructions of the cervical spine were also generated. COMPARISON:  None. FINDINGS: CT HEAD FINDINGS Bony calvarium is intact. Mild right forehead swelling is seen related to the recent injury. No findings to suggest acute hemorrhage, acute infarction or space-occupying mass lesion are noted. A small calcification in the right caudate nucleus is noted. CT CERVICAL SPINE FINDINGS Seven cervical segments are well visualized vertebral body height is well maintained. Mild osteophytic changes are noted at C4-5, C5-6 and C6-7. The odontoid is within normal limits. No prevertebral soft tissue swelling is seen. No acute fracture or acute facet abnormality is noted. The surrounding soft tissue structures are within normal limits. IMPRESSION: CT of the head: No acute intracranial abnormality noted. Mild right forehead swelling is seen related to the recent injury. CT of the cervical spine: Mild  degenerative change without acute abnormality. Electronically Signed   By: Inez Catalina M.D.   On: 07/28/2015 19:31   Ct Angio Chest Pe W/cm &/or Wo Cm  07/28/2015  CLINICAL DATA:  70 year old female with syncope. Initial encounter. EXAM: CT ANGIOGRAPHY CHEST WITH CONTRAST TECHNIQUE: Multidetector CT imaging of the chest was performed using the standard protocol during bolus administration of intravenous contrast. Multiplanar CT image reconstructions and MIPs were obtained to evaluate the vascular anatomy. CONTRAST:  100 mL Isovue 370 COMPARISON:  None available FINDINGS: Good contrast bolus timing in the pulmonary arterial tree. Positive for saddle pulmonary embolus. Moderate to large volume of hilar thrombus  bilaterally, slightly greater on the right. Axial images suggest an abnormal RV LV ratio (Series 6, image 289). No pericardial effusion. Superimposed moderate to large gastric hiatal hernia. No pleural effusions. No mediastinal lymphadenopathy. Calcified aortic atherosclerosis. Calcified coronary artery atherosclerosis. No axillary lymphadenopathy. Major airways are patent aside from atelectatic changes. Mild lower lobe ground-glass opacity, favor atelectasis. No consolidation or definite pulmonary infarct at this time. No acute osseous abnormality identified. CT Abdomen and Pelvis from today reported separately. Visualized noncontrast liver, gallbladder, spleen, pancreas, adrenal glands, kidneys, and small bowel are within normal limits. Diverticulosis of the colon. Gastric hiatal hernia as described above. Review of the MIP images confirms the above findings. IMPRESSION: 1. Positive for acute PE with saddle embolus and CT evidence of right heart strain consistent with at least submassive (intermediate risk) PE. The presence of right heart strain has been associated with an increased risk of morbidity and mortality. Please activate Code PE by paging [phone]. Critical Value/emergent results discussed in person with Dr. Shirlyn Goltz at 1605 hours on 07/28/2015. 2. No other acute findings identified in the chest or visualized upper abdomen. Moderate to large gastric hiatal hernia. Electronically Signed   By: Genevie Ann M.D.   On: 07/28/2015 16:46   Ct Cervical Spine Wo Contrast  07/28/2015  CLINICAL DATA:  Recent syncopal episode EXAM: CT HEAD WITHOUT CONTRAST CT CERVICAL SPINE WITHOUT CONTRAST TECHNIQUE: Multidetector CT imaging of the head and cervical spine was performed following the standard protocol without intravenous contrast. Multiplanar CT image reconstructions of the cervical spine were also generated. COMPARISON:  None. FINDINGS: CT HEAD FINDINGS Bony calvarium is intact. Mild right forehead swelling is  seen related to the recent injury. No findings to suggest acute hemorrhage, acute infarction or space-occupying mass lesion are noted. A small calcification in the right caudate nucleus is noted. CT CERVICAL SPINE FINDINGS Seven cervical segments are well visualized vertebral body height is well maintained. Mild osteophytic changes are noted at C4-5, C5-6 and C6-7. The odontoid is within normal limits. No prevertebral soft tissue swelling is seen. No acute fracture or acute facet abnormality is noted. The surrounding soft tissue structures are within normal limits. IMPRESSION: CT of the head: No acute intracranial abnormality noted. Mild right forehead swelling is seen related to the recent injury. CT of the cervical spine: Mild degenerative change without acute abnormality. Electronically Signed   By: Inez Catalina M.D.   On: 07/28/2015 19:31   Ct Abdomen Pelvis W Contrast  07/28/2015  CLINICAL DATA:  Recent syncopal episode and known pulmonary emboli EXAM: CT ABDOMEN AND PELVIS WITH CONTRAST TECHNIQUE: Multidetector CT imaging of the abdomen and pelvis was performed using the standard protocol following bolus administration of intravenous contrast. CONTRAST:  100 mL Isovue 370 COMPARISON:  None. FINDINGS: Bilateral pulmonary emboli as previously described. A large hiatal hernia is noted.  The liver, gallbladder, spleen, adrenal glands and pancreas are within normal limits. Kidneys are well visualized bilaterally. No obstructive changes are seen. No renal calculi noted. The bladder is well distended. The appendix is not well visualized although no inflammatory changes to suggest appendicitis are seen. Diverticular change of the colon is noted without evidence of diverticulitis. The uterus and ovaries are within normal limits. The osseous structures show no acute abnormality. IMPRESSION: Hiatal hernia. Large bilateral pulmonary emboli as previously described. No acute abnormality is noted. Electronically Signed    By: Inez Catalina M.D.   On: 07/28/2015 19:38   Dg Chest Port 1 View  07/28/2015  CLINICAL DATA:  70 year old female with syncope. Acute pulmonary embolus. Initial encounter. EXAM: PORTABLE CHEST 1 VIEW COMPARISON:  Chest CTA from today reported separately. FINDINGS: Portable AP semi upright view at 1603 hours. Mild cardiomegaly. Gastric hiatal hernia, better demonstrated by CTA. Other mediastinal contours are within normal limits. Somewhat low lung volumes. No pneumothorax, pulmonary edema, pleural effusion or consolidation. Visualized tracheal air column is within normal limits. IMPRESSION: 1. Acute pulmonary embolus, see chest CTA reported separately. 2. No superimposed acute acute radiographic findings. Electronically Signed   By: Genevie Ann M.D.   On: 07/28/2015 16:52    Microbiology: Recent Results (from the past 240 hour(s))  MRSA PCR Screening     Status: None   Collection Time: 07/28/15  6:43 PM  Result Value Ref Range Status   MRSA by PCR NEGATIVE NEGATIVE Final    Comment:        The GeneXpert MRSA Assay (FDA approved for NASAL specimens only), is one component of a comprehensive MRSA colonization surveillance program. It is not intended to diagnose MRSA infection nor to guide or monitor treatment for MRSA infections.      Labs: Basic Metabolic Panel:  Recent Labs Lab 07/28/15 1521 07/28/15 1524 07/28/15 1925 07/29/15 0455  NA 143 142 141 144  K 3.6 3.7 3.9 3.8  CL 106 109 112* 112*  CO2  --  22 22 23   GLUCOSE 93 101* 113* 92  BUN 13 10 9 8   CREATININE 0.70 0.84 0.72 0.84  CALCIUM  --  8.1* 8.2* 8.3*  MG  --   --   --  1.9  PHOS  --   --   --  3.1   Liver Function Tests:  Recent Labs Lab 07/28/15 1524 07/28/15 1925  AST 37 48*  ALT 27 37  ALKPHOS 74 78  BILITOT 0.4 0.5  PROT 5.7* 5.4*  ALBUMIN 2.7* 2.6*   No results for input(s): LIPASE, AMYLASE in the last 168 hours. No results for input(s): AMMONIA in the last 168 hours. CBC:  Recent Labs Lab  07/28/15 1524 07/28/15 1626 07/28/15 1925 07/29/15 0455 07/30/15 0254 07/31/15 0223  WBC 12.5* 10.2 6.9 6.0 8.2 9.5  NEUTROABS 10.0*  --  5.1  --   --   --   HGB 9.7* 10.0* 9.2* 9.3* 8.8* 9.1*  HCT 31.1* 30.6* 29.6* 30.1* 28.4* 28.8*  MCV 92.3 91.6 91.4 91.8 91.9 90.3  PLT 144* 155 152 159 163 208   Cardiac Enzymes: No results for input(s): CKTOTAL, CKMB, CKMBINDEX, TROPONINI in the last 168 hours. BNP: BNP (last 3 results) No results for input(s): BNP in the last 8760 hours.  ProBNP (last 3 results) No results for input(s): PROBNP in the last 8760 hours.  CBG: No results for input(s): GLUCAP in the last 168 hours.     Signed:  Dia Crawford, MD [facility] [phone] pager

## 2015-08-01 NOTE — Progress Notes (Addendum)
SATURATION QUALIFICATIONS: (This note is used to comply with regulatory documentation for home oxygen)  Patient Saturations on Room Air at Rest = 91%  Patient Saturations on Room Air while Ambulating = 97%  Patient Saturations on 0 Liters of oxygen while Ambulating = 0%  Please briefly explain why patient needs home oxygen:[facility]    , [place],   Phone:     Fax:     Physical Therapy Evaluation  Patient Details  Name: Peggy Cuevas MRN: [id number] Date of Birth: [birth year] No Data Recorded  Encounter Date: 07/28/2015    Past Medical History  Diagnosis Date  . Hypertension   . Saddle pulmonary embolus ([place]) 07/28/2015  . Cancer of left breast ([place]) 2007    Past Surgical History  Procedure Laterality Date  . Tonsillectomy  1954  . Breast biopsy Left 2007  . Breast lumpectomy Left 2007  . Tubal ligation  1974    Filed Vitals:   07/31/15 1427 07/31/15 1911 08/01/15 0304 08/01/15 0513  BP: 143/92 136/80  124/58  Pulse: 80 89  83  Temp: 97.8 F (36.6 C) 98.7 F (37.1 C)  98.9 F (37.2 C)  TempSrc: Oral Oral  Oral  Resp: 20 20  20   Height:      Weight:   73.936 kg (163 lb)   SpO2: 95% 92%  92%     Pt. Ambulated with nurse on room air with no assistive devices. Starting O2 at rest was 91-92 on room air. O2 ranged from 88-97 while ambulating, no complaints of SOB. Pt. Tolerated well, back to bed. MD notified                                     Patient will benefit from skilled therapeutic intervention in order to improve the following deficits and impairments:     Visit Diagnosis: Saddle embolus ([place])  SOB (shortness of breath)     Problem List Patient Active Problem List   Diagnosis Date Noted  . Saddle embolus ([place])   . SOB (shortness of breath)   . Syncope and collapse   . Essential hypertension   . PE (pulmonary embolism) 07/28/2015    Mena Pauls 08/01/2015, 11:06  AM  [facility]  [address]    , [place],   Phone:     Fax:     Name: Peggy Cuevas MRN: [id number] Date of Birth: 1945-05-03

## 2015-08-03 LAB — FACTOR 5 LEIDEN

## 2015-08-03 LAB — PROTHROMBIN GENE MUTATION

## 2015-08-10 ENCOUNTER — Ambulatory Visit (INDEPENDENT_AMBULATORY_CARE_PROVIDER_SITE_OTHER): Payer: Medicare Other | Admitting: Family Medicine

## 2015-08-10 ENCOUNTER — Encounter: Payer: Self-pay | Admitting: Family Medicine

## 2015-08-10 VITALS — BP 118/82 | HR 75 | Temp 98.2°F | Ht 64.5 in | Wt 158.2 lb

## 2015-08-10 DIAGNOSIS — I749 Embolism and thrombosis of unspecified artery: Secondary | ICD-10-CM | POA: Diagnosis not present

## 2015-08-10 DIAGNOSIS — D649 Anemia, unspecified: Secondary | ICD-10-CM

## 2015-08-10 DIAGNOSIS — I10 Essential (primary) hypertension: Secondary | ICD-10-CM | POA: Diagnosis not present

## 2015-08-10 DIAGNOSIS — E785 Hyperlipidemia, unspecified: Secondary | ICD-10-CM | POA: Diagnosis not present

## 2015-08-10 DIAGNOSIS — Z853 Personal history of malignant neoplasm of breast: Secondary | ICD-10-CM

## 2015-08-10 DIAGNOSIS — IMO0002 Reserved for concepts with insufficient information to code with codable children: Secondary | ICD-10-CM

## 2015-08-10 LAB — LDL CHOLESTEROL, DIRECT: Direct LDL: 133 mg/dL

## 2015-08-10 LAB — COMPREHENSIVE METABOLIC PANEL
ALBUMIN: 3.7 g/dL (ref 3.5–5.2)
ALT: 15 U/L (ref 0–35)
AST: 18 U/L (ref 0–37)
Alkaline Phosphatase: 86 U/L (ref 39–117)
BUN: 16 mg/dL (ref 6–23)
CALCIUM: 10.2 mg/dL (ref 8.4–10.5)
CHLORIDE: 102 meq/L (ref 96–112)
CO2: 29 meq/L (ref 19–32)
Creatinine, Ser: 0.75 mg/dL (ref 0.40–1.20)
GFR: 81.53 mL/min (ref >60.00)
Glucose, Bld: 86 mg/dL (ref 70–99)
POTASSIUM: 4.4 meq/L (ref 3.5–5.1)
SODIUM: 137 meq/L (ref 135–145)
Total Bilirubin: 0.4 mg/dL (ref 0.2–1.2)
Total Protein: 7.4 g/dL (ref 6.0–8.3)

## 2015-08-10 LAB — LIPID PANEL
CHOL/HDL RATIO: 5
Cholesterol: 222 mg/dL — ABNORMAL HIGH (ref 0–200)
HDL: 43.4 mg/dL (ref >39.00)
NONHDL: 178.54
TRIGLYCERIDES: 212 mg/dL — AB (ref 0.0–149.0)
VLDL: 42.4 mg/dL — AB (ref 0.0–40.0)

## 2015-08-10 LAB — CBC
HEMATOCRIT: 35.3 % — AB (ref 36.0–46.0)
Hemoglobin: 11.6 g/dL — ABNORMAL LOW (ref 12.0–15.0)
MCHC: 32.8 g/dL (ref 30.0–36.0)
MCV: 87.6 fl (ref 78.0–100.0)
PLATELETS: 393 10*3/uL (ref 150.0–400.0)
RBC: 4.03 Mil/uL (ref 3.87–5.11)
RDW: 13.5 % (ref 11.5–15.5)
WBC: 6 10*3/uL (ref 4.0–10.5)

## 2015-08-10 NOTE — Patient Instructions (Signed)
Continue your current medications.  Continue the Eliquis x 6 months.  Follow up in 6 months.  Take care  Dr. Lacinda Axon   Health Maintenance, Female Adopting a healthy lifestyle and getting preventive care can go a long way to promote health and wellness. Talk with your health care provider about what schedule of regular examinations is right for you. This is a good chance for you to check in with your provider about disease prevention and staying healthy. In between checkups, there are plenty of things you can do on your own. Experts have done a lot of research about which lifestyle changes and preventive measures are most likely to keep you healthy. Ask your health care provider for more information. WEIGHT AND DIET  Eat a healthy diet  Be sure to include plenty of vegetables, fruits, low-fat dairy products, and lean protein.  Do not eat a lot of foods high in solid fats, added sugars, or salt.  Get regular exercise. This is one of the most important things you can do for your health.  Most adults should exercise for at least 150 minutes each week. The exercise should increase your heart rate and make you sweat (moderate-intensity exercise).  Most adults should also do strengthening exercises at least twice a week. This is in addition to the moderate-intensity exercise.  Maintain a healthy weight  Body mass index (BMI) is a measurement that can be used to identify possible weight problems. It estimates body fat based on height and weight. Your health care provider can help determine your BMI and help you achieve or maintain a healthy weight.  For females 68 years of age and older:   A BMI below 18.5 is considered underweight.  A BMI of 18.5 to 24.9 is normal.  A BMI of 25 to 29.9 is considered overweight.  A BMI of 30 and above is considered obese.  Watch levels of cholesterol and blood lipids  You should start having your blood tested for lipids and cholesterol at 69 years of  age, then have this test every 5 years.  You may need to have your cholesterol levels checked more often if:  Your lipid or cholesterol levels are high.  You are older than 69 years of age.  You are at high risk for heart disease.  CANCER SCREENING   Lung Cancer  Lung cancer screening is recommended for adults 25-71 years old who are at high risk for lung cancer because of a history of smoking.  A yearly low-dose CT scan of the lungs is recommended for people who:  Currently smoke.  Have quit within the past 15 years.  Have at least a 30-pack-year history of smoking. A pack year is smoking an average of one pack of cigarettes a day for 1 year.  Yearly screening should continue until it has been 15 years since you quit.  Yearly screening should stop if you develop a health problem that would prevent you from having lung cancer treatment.  Breast Cancer  Practice breast self-awareness. This means understanding how your breasts normally appear and feel.  It also means doing regular breast self-exams. Let your health care provider know about any changes, no matter how small.  If you are in your 20s or 30s, you should have a clinical breast exam (CBE) by a health care provider every 1-3 years as part of a regular health exam.  If you are 64 or older, have a CBE every year. Also consider having a breast X-ray (  mammogram) every year.  If you have a family history of breast cancer, talk to your health care provider about genetic screening.  If you are at high risk for breast cancer, talk to your health care provider about having an MRI and a mammogram every year.  Breast cancer gene (BRCA) assessment is recommended for women who have family members with BRCA-related cancers. BRCA-related cancers include:  Breast.  Ovarian.  Tubal.  Peritoneal cancers.  Results of the assessment will determine the need for genetic counseling and BRCA1 and BRCA2 testing. Cervical  Cancer Your health care provider may recommend that you be screened regularly for cancer of the pelvic organs (ovaries, uterus, and vagina). This screening involves a pelvic examination, including checking for microscopic changes to the surface of your cervix (Pap test). You may be encouraged to have this screening done every 3 years, beginning at age 21.  For women ages 67-65, health care providers may recommend pelvic exams and Pap testing every 3 years, or they may recommend the Pap and pelvic exam, combined with testing for human papilloma virus (HPV), every 5 years. Some types of HPV increase your risk of cervical cancer. Testing for HPV may also be done on women of any age with unclear Pap test results.  Other health care providers may not recommend any screening for nonpregnant women who are considered low risk for pelvic cancer and who do not have symptoms. Ask your health care provider if a screening pelvic exam is right for you.  If you have had past treatment for cervical cancer or a condition that could lead to cancer, you need Pap tests and screening for cancer for at least 20 years after your treatment. If Pap tests have been discontinued, your risk factors (such as having a new sexual partner) need to be reassessed to determine if screening should resume. Some women have medical problems that increase the chance of getting cervical cancer. In these cases, your health care provider may recommend more frequent screening and Pap tests. Colorectal Cancer  This type of cancer can be detected and often prevented.  Routine colorectal cancer screening usually begins at 69 years of age and continues through 69 years of age.  Your health care provider may recommend screening at an earlier age if you have risk factors for colon cancer.  Your health care provider may also recommend using home test kits to check for hidden blood in the stool.  A small camera at the end of a tube can be used to  examine your colon directly (sigmoidoscopy or colonoscopy). This is done to check for the earliest forms of colorectal cancer.  Routine screening usually begins at age 62.  Direct examination of the colon should be repeated every 5-10 years through 69 years of age. However, you may need to be screened more often if early forms of precancerous polyps or small growths are found. Skin Cancer  Check your skin from head to toe regularly.  Tell your health care provider about any new moles or changes in moles, especially if there is a change in a mole's shape or color.  Also tell your health care provider if you have a mole that is larger than the size of a pencil eraser.  Always use sunscreen. Apply sunscreen liberally and repeatedly throughout the day.  Protect yourself by wearing long sleeves, pants, a wide-brimmed hat, and sunglasses whenever you are outside. HEART DISEASE, DIABETES, AND HIGH BLOOD PRESSURE   High blood pressure causes heart disease  and increases the risk of stroke. High blood pressure is more likely to develop in:  People who have blood pressure in the high end of the normal range (130-139/85-89 mm Hg).  People who are overweight or obese.  People who are African American.  If you are 61-53 years of age, have your blood pressure checked every 3-5 years. If you are 56 years of age or older, have your blood pressure checked every year. You should have your blood pressure measured twice--once when you are at a hospital or clinic, and once when you are not at a hospital or clinic. Record the average of the two measurements. To check your blood pressure when you are not at a hospital or clinic, you can use:  An automated blood pressure machine at a pharmacy.  A home blood pressure monitor.  If you are between 40 years and 49 years old, ask your health care provider if you should take aspirin to prevent strokes.  Have regular diabetes screenings. This involves taking a  blood sample to check your fasting blood sugar level.  If you are at a normal weight and have a low risk for diabetes, have this test once every three years after 69 years of age.  If you are overweight and have a high risk for diabetes, consider being tested at a younger age or more often. PREVENTING INFECTION  Hepatitis B  If you have a higher risk for hepatitis B, you should be screened for this virus. You are considered at high risk for hepatitis B if:  You were born in a country where hepatitis B is common. Ask your health care provider which countries are considered high risk.  Your parents were born in a high-risk country, and you have not been immunized against hepatitis B (hepatitis B vaccine).  You have HIV or AIDS.  You use needles to inject street drugs.  You live with someone who has hepatitis B.  You have had sex with someone who has hepatitis B.  You get hemodialysis treatment.  You take certain medicines for conditions, including cancer, organ transplantation, and autoimmune conditions. Hepatitis C  Blood testing is recommended for:  Everyone born from [birth year] through 1964.  Anyone with known risk factors for hepatitis C. Sexually transmitted infections (STIs)  You should be screened for sexually transmitted infections (STIs) including gonorrhea and chlamydia if:  You are sexually active and are younger than 69 years of age.  You are older than 69 years of age and your health care provider tells you that you are at risk for this type of infection.  Your sexual activity has changed since you were last screened and you are at an increased risk for chlamydia or gonorrhea. Ask your health care provider if you are at risk.  If you do not have HIV, but are at risk, it may be recommended that you take a prescription medicine daily to prevent HIV infection. This is called pre-exposure prophylaxis (PrEP). You are considered at risk if:  You are sexually active and do  not regularly use condoms or know the HIV status of your partner(s).  You take drugs by injection.  You are sexually active with a partner who has HIV. Talk with your health care provider about whether you are at high risk of being infected with HIV. If you choose to begin PrEP, you should first be tested for HIV. You should then be tested every 3 months for as long as you are taking PrEP.  PREGNANCY   If you are premenopausal and you may become pregnant, ask your health care provider about preconception counseling.  If you may become pregnant, take 400 to 800 micrograms (mcg) of folic acid every day.  If you want to prevent pregnancy, talk to your health care provider about birth control (contraception). OSTEOPOROSIS AND MENOPAUSE   Osteoporosis is a disease in which the bones lose minerals and strength with aging. This can result in serious bone fractures. Your risk for osteoporosis can be identified using a bone density scan.  If you are 49 years of age or older, or if you are at risk for osteoporosis and fractures, ask your health care provider if you should be screened.  Ask your health care provider whether you should take a calcium or vitamin D supplement to lower your risk for osteoporosis.  Menopause may have certain physical symptoms and risks.  Hormone replacement therapy may reduce some of these symptoms and risks. Talk to your health care provider about whether hormone replacement therapy is right for you.  HOME CARE INSTRUCTIONS   Schedule regular health, dental, and eye exams.  Stay current with your immunizations.   Do not use any tobacco products including cigarettes, chewing tobacco, or electronic cigarettes.  If you are pregnant, do not drink alcohol.  If you are breastfeeding, limit how much and how often you drink alcohol.  Limit alcohol intake to no more than 1 drink per day for nonpregnant women. One drink equals 12 ounces of beer, 5 ounces of wine, or 1  ounces of hard liquor.  Do not use street drugs.  Do not share needles.  Ask your health care provider for help if you need support or information about quitting drugs.  Tell your health care provider if you often feel depressed.  Tell your health care provider if you have ever been abused or do not feel safe at home.   This information is not intended to replace advice given to you by your health care provider. Make sure you discuss any questions you have with your health care provider.   Document Released: 10/18/2010 Document Revised: 04/25/2014 Document Reviewed: 03/06/2013 Elsevier Interactive Patient Education [employer].

## 2015-08-11 DIAGNOSIS — D509 Iron deficiency anemia, unspecified: Secondary | ICD-10-CM | POA: Insufficient documentation

## 2015-08-11 DIAGNOSIS — Z853 Personal history of malignant neoplasm of breast: Secondary | ICD-10-CM | POA: Insufficient documentation

## 2015-08-11 DIAGNOSIS — E785 Hyperlipidemia, unspecified: Secondary | ICD-10-CM | POA: Insufficient documentation

## 2015-08-11 NOTE — Assessment & Plan Note (Signed)
Well controlled. Continue Losartan.

## 2015-08-11 NOTE — Progress Notes (Signed)
Subjective:  Patient ID: Peggy Cuevas, female    DOB: Nov 02, 1945  Age: 70 y.o. MRN: [id number]  CC: Establish care  HPI Peggy Cuevas is a 70 y.o. female presents to the clinic today to establish care. Issues concerns are below.  PE  Patient suffered a syncopal episode on 4/11. She was taken to the ED.  She was found to be hypoxic and hypotensive.  CT angio revealed saddle pulmonary embolus.  She was initially started on TPA.  She quickly improved and TPA was stopped after approximately 20 minutes.  She was subsequently admitted to the ICU.  She was placed on a Heparin drip.  Lower extremity doppler revealed extensive clot burden of the left lower leg.  She was transitioned to Eliquis and was discharged home in stable condition.  Hypercoagulable work up was negative.  Of note, PE appears to be provoked (cause = presumed from travel).  Patient resents today for follow-up regarding this. In to establish care.  She states that she is feeling well.  Tolerating Eliquis without difficulty.  She doesn't that she's had some shortness of breath but this appears to be stable.  No other complaints at this time.  HTN  Well controlled on Losartan.   PMH, Surgical Hx, Family Hx, Social History reviewed and updated as below.  Past Medical History  Diagnosis Date  . Hypertension   . Saddle pulmonary [place]us ([place] 07/28/2015  . Cancer of left breast ([place]) 2007   Past Surgical History  Procedure Laterality Date  . Tonsillectomy  1954  . Breast biopsy Left 2007  . Breast lumpectomy Left 2007  . Tubal ligation  1974   Family History  Problem Relation Age of Onset  . Heart disease Mother    Social History  Substance Use Topics  . Smoking status: Never Smoker   . Smokeless tobacco: Never Used  . Alcohol Use: Yes     Comment: 07/29/2015 "might have a drink 4-6 times/year"    Review of Systems  Respiratory: Positive for shortness of breath.   Musculoskeletal:         Muscle pain (left leg; secondary to DVT).  All other systems reviewed and are negative.  Objective:   Today's Vitals: BP 118/82 mmHg  Pulse 75  Temp(Src) 98.2 F (36.8 C) (Oral)  Ht 5' 4.5" (1.638 m)  Wt 158 lb 4 oz (71.782 kg)  BMI 26.75 kg/m2  SpO2 95%  Physical Exam  Constitutional: She is oriented to person, place, and time. She appears well-developed and well-nourished. No distress.  HENT:  Head: Normocephalic and atraumatic.  Mouth/Throat: Oropharynx is clear and moist. No oropharyngeal exudate.  Normal TM's bilaterally.   Eyes: Conjunctivae are normal. No scleral icterus.  Neck: Neck supple. No thyromegaly present.  Cardiovascular: Normal rate and regular rhythm.   No murmur heard. Pulmonary/Chest: Effort normal and breath sounds normal. She has no wheezes. She has no rales.  Abdominal: Soft. She exhibits no distension. There is no tenderness. There is no rebound and no guarding.  Musculoskeletal: Normal range of motion. She exhibits no edema.  Lymphadenopathy:    She has no cervical adenopathy.  Neurological: She is alert and oriented to person, place, and time.  Skin: Skin is warm and dry. No rash noted.  Psychiatric: She has a normal mood and affect.  Vitals reviewed.  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 Stable currently. Presumed  provoked from travel. Patient with DVT >>> Saddle embolus. Given severity (submassive PE, syncope, large clot burden), treating for AT LEAST 6 months. Patient to continue Eliquis. Will reassess and discuss again at follow up. Will plan to repeat imaging at that time.      Essential hypertension    Well controlled. Continue Losartan.      Relevant Orders   Comp Met (CMET) (Completed)   Anemia    New problem. Noted on recent labs. Repeat CBC today showed improvement. Will continue to monitor closely.      Relevant Orders   CBC (Completed)    Hyperlipidemia    Lipid Panel     Component Value Date/Time   CHOL 222* 08/10/2015 1032   TRIG 212.0* 08/10/2015 1032   HDL 43.40 08/10/2015 1032   CHOLHDL 5 08/10/2015 1032   VLDL 42.4* 08/10/2015 1032   LDLDIRECT 133.0 08/10/2015 1032   ASCVD risk score - 9.7 % (10 year). Recommend statin therapy.      Relevant Orders   Lipid Profile (Completed)      Outpatient Encounter Prescriptions as of 08/10/2015  Medication Sig  . apixaban (ELIQUIS) 5 MG TABS tablet Take 1 tablet (5 mg total) by mouth 2 (two) times daily.  Marland Kitchen CALCIUM PO Take 1 tablet by mouth daily.  . Cholecalciferol (VITAMIN D-3 PO) Take 1 tablet by mouth daily.   Marland Kitchen doxycycline (PERIOSTAT) 20 MG tablet Take 20 mg by mouth 2 (two) times daily. Only taking once a day  . losartan (COZAAR) 100 MG tablet Take 100 mg by mouth daily.  . [DISCONTINUED] apixaban (ELIQUIS) 5 MG TABS tablet Take 2 tablets (10 mg total) by mouth 2 (two) times daily.  . [DISCONTINUED] pantoprazole (PROTONIX) 40 MG tablet Take 1 tablet (40 mg total) by mouth daily at 12 noon.   No facility-administered encounter medications on file as of 08/10/2015.    Follow-up: 6 months.  [place]

## 2015-08-11 NOTE — Assessment & Plan Note (Signed)
New problem. Noted on recent labs. Repeat CBC today showed improvement. Will continue to monitor closely.

## 2015-08-11 NOTE — Assessment & Plan Note (Signed)
Lipid Panel     Component Value Date/Time   CHOL 222* 08/10/2015 1032   TRIG 212.0* 08/10/2015 1032   HDL 43.40 08/10/2015 1032   CHOLHDL 5 08/10/2015 1032   VLDL 42.4* 08/10/2015 1032   LDLDIRECT 133.0 08/10/2015 1032   ASCVD risk score - 9.7 % (10 year). Recommend statin therapy.

## 2015-08-11 NOTE — Assessment & Plan Note (Addendum)
New problem. Stable currently. Presumed provoked from travel. Patient with DVT >>> Saddle embolus. Given severity (submassive PE, syncope, large clot burden), treating for AT LEAST 6 months. Patient to continue Eliquis. Will reassess and discuss again at follow up. Will plan to repeat imaging at that time.

## 2015-08-12 ENCOUNTER — Other Ambulatory Visit: Payer: Self-pay | Admitting: Family Medicine

## 2015-08-12 ENCOUNTER — Telehealth: Payer: Self-pay | Admitting: Family Medicine

## 2015-08-12 MED ORDER — ATORVASTATIN CALCIUM 40 MG PO TABS: 40.0000 mg | ORAL_TABLET | Freq: Every day | ORAL | Status: DC

## 2015-08-12 NOTE — Telephone Encounter (Signed)
Pt has some questions with taking the atorvastatin (LIPITOR) 40 MG tablet. And also some other health questions as well. Call pt @ [phone]. Thank you!

## 2015-08-12 NOTE — Telephone Encounter (Signed)
Called patient and was unable to leave voicemail due to her voicemail being full.

## 2015-08-12 NOTE — Telephone Encounter (Signed)
Refilled 08/07/15. Please advise?

## 2015-08-12 NOTE — Telephone Encounter (Signed)
Pt stated she wanted to hold off on the medication to see if she could get the numbers down with diet and exercise.

## 2015-08-18 ENCOUNTER — Encounter: Payer: Self-pay | Admitting: Family Medicine

## 2015-08-20 LAB — HM MAMMOGRAPHY

## 2015-08-21 ENCOUNTER — Encounter: Payer: Self-pay | Admitting: Family Medicine

## 2015-08-26 ENCOUNTER — Telehealth: Payer: Self-pay | Admitting: Family Medicine

## 2015-08-26 NOTE — Telephone Encounter (Signed)
Pt was called to let her know that we have received medical records from [place].

## 2015-09-21 ENCOUNTER — Other Ambulatory Visit: Payer: Self-pay | Admitting: Family Medicine

## 2015-09-21 NOTE — Telephone Encounter (Signed)
Prescribed 07/2015. please advise?

## 2015-09-22 ENCOUNTER — Other Ambulatory Visit: Payer: Self-pay | Admitting: Family Medicine

## 2015-10-12 ENCOUNTER — Telehealth: Payer: Self-pay

## 2015-10-12 ENCOUNTER — Other Ambulatory Visit: Payer: Self-pay | Admitting: Family Medicine

## 2015-10-12 DIAGNOSIS — E785 Hyperlipidemia, unspecified: Secondary | ICD-10-CM

## 2015-10-12 DIAGNOSIS — D649 Anemia, unspecified: Secondary | ICD-10-CM

## 2015-10-12 NOTE — Telephone Encounter (Signed)
Pt coming for lab work 10/13/15. From review of chart looks like we are following up on her anemia; any other future orders needed? Thank you.

## 2015-10-13 ENCOUNTER — Other Ambulatory Visit (INDEPENDENT_AMBULATORY_CARE_PROVIDER_SITE_OTHER): Payer: Medicare Other

## 2015-10-13 DIAGNOSIS — D649 Anemia, unspecified: Secondary | ICD-10-CM | POA: Diagnosis not present

## 2015-10-13 DIAGNOSIS — E785 Hyperlipidemia, unspecified: Secondary | ICD-10-CM | POA: Diagnosis not present

## 2015-10-13 LAB — IBC PANEL
IRON: 40 ug/dL — AB (ref 42–145)
Saturation Ratios: 8.8 % — ABNORMAL LOW (ref 20.0–50.0)
Transferrin: 326 mg/dL (ref 212.0–360.0)

## 2015-10-13 LAB — IRON AND TIBC
%SAT: 9 % — AB (ref 11–50)
IRON: 37 ug/dL — AB (ref 45–160)
TIBC: 393 ug/dL (ref 250–450)
UIBC: 356 ug/dL (ref 125–400)

## 2015-10-13 LAB — LIPID PANEL
Cholesterol: 227 mg/dL — ABNORMAL HIGH (ref 0–200)
HDL: 50.4 mg/dL (ref >39.00)
LDL Cholesterol: 140 mg/dL — ABNORMAL HIGH (ref 0–99)
NONHDL: 176.32
Total CHOL/HDL Ratio: 4
Triglycerides: 182 mg/dL — ABNORMAL HIGH (ref 0.0–149.0)
VLDL: 36.4 mg/dL (ref 0.0–40.0)

## 2015-10-13 LAB — FERRITIN: Ferritin: 5.5 ng/mL — ABNORMAL LOW (ref 10.0–291.0)

## 2015-10-13 LAB — IRON: IRON: 40 ug/dL — AB (ref 42–145)

## 2015-10-22 ENCOUNTER — Encounter: Payer: Self-pay | Admitting: Family Medicine

## 2015-10-22 ENCOUNTER — Ambulatory Visit (INDEPENDENT_AMBULATORY_CARE_PROVIDER_SITE_OTHER): Payer: Medicare Other | Admitting: Family Medicine

## 2015-10-22 VITALS — BP 114/82 | HR 96 | Temp 98.7°F | Wt 165.5 lb

## 2015-10-22 DIAGNOSIS — I749 Embolism and thrombosis of unspecified artery: Secondary | ICD-10-CM

## 2015-10-22 DIAGNOSIS — D649 Anemia, unspecified: Secondary | ICD-10-CM

## 2015-10-22 DIAGNOSIS — Z1211 Encounter for screening for malignant neoplasm of colon: Secondary | ICD-10-CM | POA: Diagnosis not present

## 2015-10-22 DIAGNOSIS — I10 Essential (primary) hypertension: Secondary | ICD-10-CM

## 2015-10-22 DIAGNOSIS — E785 Hyperlipidemia, unspecified: Secondary | ICD-10-CM

## 2015-10-22 DIAGNOSIS — IMO0002 Reserved for concepts with insufficient information to code with codable children: Secondary | ICD-10-CM

## 2015-10-22 MED ORDER — FERROUS SULFATE 325 (65 FE) MG PO TABS: 325.0000 mg | ORAL_TABLET | Freq: Two times a day (BID) | ORAL | Status: DC

## 2015-10-22 MED ORDER — APIXABAN 5 MG PO TABS: 5.0000 mg | ORAL_TABLET | Freq: Two times a day (BID) | ORAL | Status: DC

## 2015-10-22 NOTE — Progress Notes (Signed)
Pre visit review using our clinic review tool, if applicable. No additional management support is needed unless otherwise documented below in the visit note. 

## 2015-10-22 NOTE — Patient Instructions (Signed)
We will call with your results.  Take the iron as prescribed.  We will call with your referral for colonoscopy.  Follow up in October.   We will plan to stop eliquis then.  Take care  Dr. Lacinda Axon

## 2015-10-23 LAB — CBC
HCT: 35.1 % — ABNORMAL LOW (ref 36.0–46.0)
HEMOGLOBIN: 11.5 g/dL — AB (ref 12.0–15.0)
MCHC: 32.7 g/dL (ref 30.0–36.0)
MCV: 81.1 fl (ref 78.0–100.0)
PLATELETS: 304 10*3/uL (ref 150.0–400.0)
RBC: 4.33 Mil/uL (ref 3.87–5.11)
RDW: 16.2 % — ABNORMAL HIGH (ref 11.5–15.5)
WBC: 7.3 10*3/uL (ref 4.0–10.5)

## 2015-10-23 NOTE — Assessment & Plan Note (Signed)
Uncontrolled. ASCVD risk score 8.8%. Patient wants to hold off on statin therapy at this time. Will reevaluate in October.

## 2015-10-23 NOTE — Assessment & Plan Note (Signed)
Repeat CBC today. Starting iron therapy. Scheduling for colonoscopy.

## 2015-10-23 NOTE — Assessment & Plan Note (Signed)
Established problem, Doing well on treatment. Feeling well. Continue close.  Given that this appears to be provoked, will plan on stopping treatment after 6 months (at follow up visit in October). Patient very reluctant/concerned regarding this. Will consider repeat CT angiogram prior to discontinuation.

## 2015-10-23 NOTE — Assessment & Plan Note (Signed)
Well-controlled -Continue losartan 

## 2015-10-23 NOTE — Progress Notes (Addendum)
Subjective:  Patient ID: Peggy Cuevas, female    DOB: [birth year]  Age: 70 y.o. MRN: [id number]  CC: Follow up  HPI:  70 year old female with a history of pulmonary embolism, hypertension, hyperlipidemia, and anemia presents for follow-up.  PE  Patient with a syncopal episode on 4/11.  Admitted at [facility] and found to have a saddle pulmonary embolus.  Clot thought to be provoked from frequent and long travel.  Hypercoagulable workup was negative.  She is currently doing well and is still on treatment with Eliquis.  Plan for 6 months of treatment.  She is doing extremely well. She's had no shortness of breath. She's been exerting herself without difficulty.  Tolerating Eliquis.  HTN  Well controlled on losartan.  Hyperlipidemia  Uncontrolled.  Is not currently on treatment.  ASCVD 10 year risk - 8.8%.  Anemia  Most recent hemoglobin 11.6.  Iron studies consistent with iron deficiency anemia.  Unclear etiology for her iron deficiency.  She denies any hematochezia, melena.  She feels well otherwise.  Her last colonoscopy was in 2008.  Social Hx   Social History   Social History  . Marital Status: Married    Spouse Name: N/A  . Number of Children: N/A  . Years of Education: N/A   Social History Main Topics  . Smoking status: Never Smoker   . Smokeless tobacco: Never Used  . Alcohol Use: Yes     Comment: 07/29/2015 "might have a drink 4-6 times/year"  . Drug Use: No  . Sexual Activity: Yes   Other Topics Concern  . None   Social History Narrative   Review of Systems  Constitutional: Negative.   Respiratory: Negative for shortness of breath.    Objective:  BP 114/82 mmHg  Pulse 96  Temp(Src) 98.7 F (37.1 C) (Oral)  Wt 165 lb 8 oz (75.07 kg)  SpO2 95%  BP/Weight 10/22/2015 08/10/2015 [id number]  Systolic BP [id number]  Diastolic BP 82 82 58  Wt. (Lbs) 165.5 158.25 163  BMI 27.98 26.75 -   Physical Exam  Constitutional: She is  oriented to person, place, and time. She appears well-developed. No distress.  Cardiovascular: Normal rate and regular rhythm.   Pulmonary/Chest: Effort normal. She has no wheezes. She has no rales.  Neurological: She is alert and oriented to person, place, and time.  Psychiatric: She has a normal mood and affect.  Vitals reviewed.  Lab Results  Component Value Date   WBC 6.0 08/10/2015   HGB 11.6* 08/10/2015   HCT 35.3* 08/10/2015   PLT 393.0 08/10/2015   GLUCOSE 86 08/10/2015   CHOL 227* 10/13/2015   TRIG 182.0* 10/13/2015   HDL 50.40 10/13/2015   LDLDIRECT 133.0 08/10/2015   LDLCALC 140* 10/13/2015   ALT 15 08/10/2015   AST 18 08/10/2015   NA 137 08/10/2015   K 4.4 08/10/2015   CL 102 08/10/2015   CREATININE 0.75 08/10/2015   BUN 16 08/10/2015   CO2 29 08/10/2015   INR 1.28 07/28/2015   Assessment & Plan:   Problem List Items Addressed This Visit    Saddle embolus ([place]) - Primary    Established problem, Doing well on treatment. Feeling well. Continue close.  Given that this appears to be provoked, will plan on stopping treatment after 6 months (at follow up visit in October). Patient very reluctant/concerned regarding this. Will consider repeat CT angiogram prior to discontinuation.      Relevant Medications   apixaban (ELIQUIS) 5 MG TABS tablet  Essential hypertension    Well controlled. Continue losartan.      Relevant Medications   apixaban (ELIQUIS) 5 MG TABS tablet   Anemia    Repeat CBC today. Starting iron therapy. Scheduling for colonoscopy.      Relevant Medications   ferrous sulfate 325 (65 FE) MG tablet   Other Relevant Orders   CBC   Ambulatory referral to Gastroenterology   Hyperlipidemia    Uncontrolled. ASCVD risk score 8.8%. Patient wants to hold off on statin therapy at this time. Will reevaluate in October.      Relevant Medications   apixaban (ELIQUIS) 5 MG TABS tablet    Other Visit Diagnoses    Encounter for screening  colonoscopy        Relevant Orders    Ambulatory referral to Gastroenterology       Meds ordered this encounter  Medications  . apixaban (ELIQUIS) 5 MG TABS tablet    Sig: Take 1 tablet (5 mg total) by mouth 2 (two) times daily.    Dispense:  180 tablet    Refill:  0  . ferrous sulfate 325 (65 FE) MG tablet    Sig: Take 1 tablet (325 mg total) by mouth 2 (two) times daily with a meal.    Dispense:  60 tablet    Refill:  3   Follow-up: October.  [place]

## 2015-11-24 ENCOUNTER — Ambulatory Visit (INDEPENDENT_AMBULATORY_CARE_PROVIDER_SITE_OTHER): Payer: Medicare Other | Admitting: Gastroenterology

## 2015-11-24 ENCOUNTER — Encounter: Payer: Self-pay | Admitting: Gastroenterology

## 2015-11-24 VITALS — BP 144/100 | HR 80 | Ht 63.5 in | Wt 164.5 lb

## 2015-11-24 DIAGNOSIS — D509 Iron deficiency anemia, unspecified: Secondary | ICD-10-CM

## 2015-11-24 DIAGNOSIS — I749 Embolism and thrombosis of unspecified artery: Secondary | ICD-10-CM | POA: Diagnosis not present

## 2015-11-24 DIAGNOSIS — Z7901 Long term (current) use of anticoagulants: Secondary | ICD-10-CM

## 2015-11-24 DIAGNOSIS — IMO0002 Reserved for concepts with insufficient information to code with codable children: Secondary | ICD-10-CM

## 2015-11-24 NOTE — Patient Instructions (Signed)
If you are age 69 or older, your body mass index should be between 23-30. Your Body mass index is 28.68 kg/m. If this is out of the aforementioned range listed, please consider follow up with your Primary Care Provider.  If you are age 21 or younger, your body mass index should be between 19-25. Your Body mass index is 28.68 kg/m. If this is out of the aformentioned range listed, please consider follow up with your Primary Care Provider.   We will contact you in 2 months to schedule your colon/EGD.  Thank you for choosing  GI  Dr Wilfrid Lund III

## 2015-11-24 NOTE — Progress Notes (Signed)
[facility] Consult Note:  History: Peggy Cuevas 11/24/2015  Referring physician: Coral Spikes, DO  Reason for consult/chief complaint: Anemia and Colon Cancer Screening   Subjective  HPI:  This is a 70 year old woman referred by primary care for iron deficiency anemia. Her last colonoscopy was about 10 years ago in [place] was admitted to the hospital in early April of this year for a saddle pulmonary embolism and DVT. She had  hypoxia and was in the ICU. She was going to get TPA, but apparently improved quickly on heparin. She has been on Eliquis since then. She was noted to have a mild normocytic anemia in late April, her iron level was low in late June, hemoglobin still 11.5 in early July. She denies early satiety, nausea, vomiting, dysphagia, weight loss, altered bowel habits, abdominal pain or rectal bleeding.  ROS:  Review of Systems  Constitutional: Negative for appetite change and unexpected weight change.  HENT: Negative for mouth sores and voice change.   Eyes: Negative for pain and redness.  Respiratory: Negative for cough and shortness of breath.   Cardiovascular: Negative for chest pain and palpitations.  Genitourinary: Negative for dysuria and hematuria.  Musculoskeletal: Negative for arthralgias and myalgias.  Skin: Negative for pallor and rash.  Neurological: Negative for weakness and headaches.  Hematological: Negative for adenopathy.     Past Medical History: Past Medical History:  Diagnosis Date  . Anemia   . Cancer of left breast ([place]) 2007  . Hypertension   . Malignant neoplasm ([place]) 2007   left  . Saddle pulmonary embolus ([place]) 07/28/2015     Past Surgical History: Past Surgical History:  Procedure Laterality Date  . BREAST BIOPSY Left 2007  . BREAST LUMPECTOMY Left 06/29/05  . TONSILLECTOMY  1954  . TUBAL LIGATION  1974     Family History: Family History  Problem Relation Age of Onset  . Heart disease Mother    CHF  . Breast cancer Paternal Aunt     Social History: Social History   Social History  . Marital status: Married    Spouse name: N/A  . Number of children: 2  . Years of education: N/A   Occupational History  . retired Pharmacist, hospital    Social History Main Topics  . Smoking status: Never Smoker  . Smokeless tobacco: Never Used  . Alcohol use Yes     Comment: 07/29/2015 "might have a drink 4-6 times/year"  . Drug use: No  . Sexual activity: Yes   Other Topics Concern  . None   Social History Narrative  . None    Allergies: Allergies  Allergen Reactions  . Benazepril Hcl   . Oxytrol For Women [Oxybutynin]   . Taxol [Paclitaxel]     Outpatient Meds: Current Outpatient Prescriptions  Medication Sig Dispense Refill  . apixaban (ELIQUIS) 5 MG TABS tablet Take 1 tablet (5 mg total) by mouth 2 (two) times daily. 180 tablet 0  . CALCIUM PO Take 1 tablet by mouth daily.    . Cholecalciferol (VITAMIN D-3 PO) Take 1 tablet by mouth daily.     Marland Kitchen doxycycline (PERIOSTAT) 20 MG tablet Take 20 mg by mouth 2 (two) times daily. Only taking once a day  11  . ferrous sulfate 325 (65 FE) MG tablet Take 1 tablet (325 mg total) by mouth 2 (two) times daily with a meal. 60 tablet 3  . losartan (COZAAR) 100 MG tablet Take 100 mg by mouth daily.  No current facility-administered medications for this visit.       ___________________________________________________________________ Objective   Exam:  BP (!) 144/100 (BP Location: Left Arm, Patient Position: Sitting, Cuff Size: Normal)   Pulse 80   Ht 5' 3.5" (1.613 m) Comment: height measured without shoes  Wt 164 lb 8 oz (74.6 kg)   BMI 28.68 kg/m    General: this is a(n) Well-appearing woman   Eyes: sclera anicteric, no redness  ENT: oral mucosa moist without lesions, no cervical or supraclavicular lymphadenopathy, good dentition  CV: RRR without murmur, S1/S2, no JVD, no peripheral edema  Resp: clear to auscultation  bilaterally, normal RR and effort noted  GI: soft, no tenderness, with active bowel sounds. No guarding or palpable organomegaly noted.  Skin; warm and dry, no rash or jaundice noted  Neuro: awake, alert and oriented x 3. Normal gross motor function and fluent speech  Labs:  CBC    Component Value Date/Time   WBC 7.3 10/22/2015 1536   RBC 4.33 10/22/2015 1536   HGB 11.5 (L) 10/22/2015 1536   HCT 35.1 (L) 10/22/2015 1536   PLT 304.0 10/22/2015 1536   MCV 81.1 10/22/2015 1536   MCH 28.5 07/31/2015 0223   MCHC 32.7 10/22/2015 1536   RDW 16.2 (H) 10/22/2015 1536   LYMPHSABS 1.4 07/28/2015 1925   MONOABS 0.4 07/28/2015 1925   EOSABS 0.0 07/28/2015 1925   BASOSABS 0.0 07/28/2015 1925   Ferritin 9  iron sat 9%  Hemoglobin the same in late April. Radiologic Studies:  Saddle PE with pos leg DVT 07/28/15  Assessment: Encounter Diagnoses  Name Primary?  Marland Kitchen Anemia, iron deficiency Yes  . Saddle embolus ([place])   . Chronic anticoagulation     She needs an EGD and colonoscopy to work up iron deficiency anemia, but it must wait 2 months until she is likely to be done with [facility] for her PE. Fortunately, she has no red flag symptoms and only mild anemia.  Plan:  We will get a message to primary care, and plan to call the patient in 2 months with an update on her anticoagulation. I expect that point, she will likely be able to be off the Eliquis for 2 days before and perhaps up to 2 days after endoscopic procedures.  While, she will remain on iron supplements and follow up with primary care  Thank you for the courtesy of this consult.  Please call me with any questions or concerns.  Nelida Meuse III  CC: Coral Spikes, DO

## 2016-01-05 ENCOUNTER — Telehealth: Payer: Self-pay | Admitting: *Deleted

## 2016-01-05 ENCOUNTER — Telehealth: Payer: Self-pay | Admitting: Family Medicine

## 2016-01-05 NOTE — Telephone Encounter (Signed)
Pt is going to the dentist next week. She was wondering if she should go since she is taking apixaban (ELIQUIS) 5 MG TABS tablet.

## 2016-01-05 NOTE — Telephone Encounter (Addendum)
Notified patient and she is OK to wait.

## 2016-01-05 NOTE — Telephone Encounter (Signed)
Spoke with patient and clarified it is OK to go to the dentist for a standard routine visit while taking ELIQUIS.  However, prior to dental extractions she needs to contact PCP.

## 2016-01-05 NOTE — Telephone Encounter (Signed)
I would wait until we discuss this in Oct.

## 2016-01-05 NOTE — Telephone Encounter (Signed)
Patient requested to have a referral to hematologist, for a second opinion in reference to coming off of the Eliquis  She requested to see Dr. Gareth Morgan  Fax [phone]  Pt contact [phone]

## 2016-01-15 ENCOUNTER — Ambulatory Visit (INDEPENDENT_AMBULATORY_CARE_PROVIDER_SITE_OTHER): Payer: Medicare Other | Admitting: Family

## 2016-01-15 ENCOUNTER — Encounter: Payer: Self-pay | Admitting: Family

## 2016-01-15 VITALS — BP 142/98 | HR 78 | Temp 98.0°F | Wt 169.2 lb

## 2016-01-15 DIAGNOSIS — I749 Embolism and thrombosis of unspecified artery: Secondary | ICD-10-CM

## 2016-01-15 DIAGNOSIS — H6691 Otitis media, unspecified, right ear: Secondary | ICD-10-CM

## 2016-01-15 DIAGNOSIS — Z86711 Personal history of pulmonary embolism: Secondary | ICD-10-CM | POA: Diagnosis not present

## 2016-01-15 MED ORDER — APIXABAN 5 MG PO TABS: 5.0000 mg | ORAL_TABLET | Freq: Two times a day (BID) | ORAL | 0 refills | Status: AC

## 2016-01-15 MED ORDER — AMOXICILLIN 500 MG PO CAPS: 500.0000 mg | ORAL_CAPSULE | Freq: Two times a day (BID) | ORAL | 0 refills | Status: DC

## 2016-01-15 NOTE — Progress Notes (Signed)
Subjective:    Patient ID: Peggy Cuevas, female    DOB: November 17, 1945, 70 y.o.   MRN: [id number]  CC: Peggy Cuevas is a 70 y.o. female who presents today for an acute visit.    HPI: Here for acute visit for right ear ache 2 days ago, waxing and waning. Prior to ear ache had sore throat and cough. Continues to have dry cough which has improved. Has tried Tylenol with some relief.  No ear discharge.  No fever, chills. Hasn't had ear infection since young girl.   Patient would also like refill of Eliquis for h/o PE 4/ 2017 after travel. No active bleeding. She sees PCP 01/2016 to decide if will continue Elliquis.     HISTORY:  Past Medical History:  Diagnosis Date  . Anemia   . Cancer of left breast ([place]) 2007  . Hypertension   . Malignant neoplasm ([place]) 2007   left  . Saddle pulmonary embolus ([place]) 07/28/2015   Past Surgical History:  Procedure Laterality Date  . BREAST BIOPSY Left 2007  . BREAST LUMPECTOMY Left 06/29/05  . TONSILLECTOMY  1954  . TUBAL LIGATION  1974   Family History  Problem Relation Age of Onset  . Heart disease Mother     CHF  . Breast cancer Paternal Aunt     Allergies: Benazepril hcl; Oxytrol for women [oxybutynin]; and Taxol [paclitaxel] Current Outpatient Prescriptions on File Prior to Visit  Medication Sig Dispense Refill  . CALCIUM PO Take 1 tablet by mouth daily.    . Cholecalciferol (VITAMIN D-3 PO) Take 1 tablet by mouth daily.     Marland Kitchen doxycycline (PERIOSTAT) 20 MG tablet Take 20 mg by mouth 2 (two) times daily. Only taking once a day  11  . ferrous sulfate 325 (65 FE) MG tablet Take 1 tablet (325 mg total) by mouth 2 (two) times daily with a meal. 60 tablet 3  . losartan (COZAAR) 100 MG tablet Take 100 mg by mouth daily.     No current facility-administered medications on file prior to visit.     Social History  Substance Use Topics  . Smoking status: Never Smoker  . Smokeless tobacco: Never Used  . Alcohol use Yes     Comment:  07/29/2015 "might have a drink 4-6 times/year"    Review of Systems  Constitutional: Negative for chills and fever.  HENT: Positive for ear pain (right). Negative for postnasal drip, sinus pressure and sore throat.   Respiratory: Positive for cough. Negative for shortness of breath and wheezing.   Cardiovascular: Negative for chest pain, palpitations and leg swelling.  Gastrointestinal: Negative for nausea and vomiting.      Objective:    BP (!) 142/98   Pulse 78   Temp 98 F (36.7 C) (Oral)   Wt 169 lb 3.2 oz (76.7 kg)   SpO2 95%   BMI 29.50 kg/m    Physical Exam  Constitutional: She appears well-developed and well-nourished.  HENT:  Head: Normocephalic and atraumatic.  Right Ear: Hearing, external ear and ear canal normal. No drainage, swelling or tenderness. No foreign bodies. Tympanic membrane is erythematous. Tympanic membrane is not bulging. No middle ear effusion. No decreased hearing is noted.  Left Ear: Hearing, tympanic membrane, external ear and ear canal normal. No drainage, swelling or tenderness. No foreign bodies. Tympanic membrane is not erythematous and not bulging.  No middle ear effusion. No decreased hearing is noted.  Nose: Nose normal. No rhinorrhea. Right sinus exhibits no maxillary  sinus tenderness and no frontal sinus tenderness. Left sinus exhibits no maxillary sinus tenderness and no frontal sinus tenderness.  Mouth/Throat: Uvula is midline, oropharynx is clear and moist and mucous membranes are normal. No oropharyngeal exudate, posterior oropharyngeal edema, posterior oropharyngeal erythema or tonsillar abscesses.  Moderate cerumen bilateral ears.   Eyes: Conjunctivae are normal.  Cardiovascular: Regular rhythm, normal heart sounds and normal pulses.   Pulmonary/Chest: Effort normal and breath sounds normal. She has no wheezes. She has no rhonchi. She has no rales.  Lymphadenopathy:       Head (right side): No submental, no submandibular, no tonsillar,  no preauricular, no posterior auricular and no occipital adenopathy present.       Head (left side): No submental, no submandibular, no tonsillar, no preauricular, no posterior auricular and no occipital adenopathy present.    She has no cervical adenopathy.  Neurological: She is alert.  Skin: Skin is warm and dry.  Psychiatric: She has a normal mood and affect. Her speech is normal and behavior is normal. Thought content normal.  Vitals reviewed.      Assessment & Plan:   1. History of pulmonary embolism Provoked PE within last 6 months. H/o breast cancer. No active bleeding. Refilled until patient can be seen by PCP to discuss continuation.  - apixaban (ELIQUIS) 5 MG TABS tablet; Take 1 tablet (5 mg total) by mouth 2 (two) times daily.  Dispense: 180 tablet; Refill: 0  2. Acute right otitis media, recurrence not specified, unspecified otitis media type Symptoms c/w AOM. Afebrile.First line amoxicillin.   - amoxicillin (AMOXIL) 500 MG capsule; Take 1 capsule (500 mg total) by mouth 2 (two) times daily.  Dispense: 14 capsule; Refill: 0     I am having Ms. Pollett start on amoxicillin. I am also having her maintain her losartan, Cholecalciferol (VITAMIN D-3 PO), CALCIUM PO, doxycycline, ferrous sulfate, and apixaban.   Meds ordered this encounter  Medications  . apixaban (ELIQUIS) 5 MG TABS tablet    Sig: Take 1 tablet (5 mg total) by mouth 2 (two) times daily.    Dispense:  180 tablet    Refill:  0    Order Specific Question:   Supervising Provider    Answer:   Deborra Medina L [[id number]]  . amoxicillin (AMOXIL) 500 MG capsule    Sig: Take 1 capsule (500 mg total) by mouth 2 (two) times daily.    Dispense:  14 capsule    Refill:  0    Order Specific Question:   Supervising Provider    Answer:   Crecencio Mc [[id number]]    Return precautions given.   Risks, benefits, and alternatives of the medications and treatment plan prescribed today were discussed, and patient expressed  understanding.   Education regarding symptom management and diagnosis given to patient on AVS.  Continue to follow with Coral Spikes, DO for routine health maintenance.   Peggy Cuevas and I agreed with plan.   Mable Paris, FNP

## 2016-01-15 NOTE — Progress Notes (Signed)
Pre visit review using our clinic review tool, if applicable. No additional management support is needed unless otherwise documented below in the visit note. 

## 2016-01-15 NOTE — Patient Instructions (Signed)
Pleasure meeting you.    Amoxicillin for ear pain.    If there is no improvement in your symptoms, or if there is any worsening of symptoms, or if you have any additional concerns, please return for re-evaluation; or, if we are closed, consider going to the Emergency Room for evaluation if symptoms urgent.

## 2016-01-20 ENCOUNTER — Other Ambulatory Visit: Payer: Self-pay | Admitting: Family Medicine

## 2016-01-20 ENCOUNTER — Telehealth: Payer: Self-pay | Admitting: Family Medicine

## 2016-01-20 MED ORDER — FLUCONAZOLE 150 MG PO TABS: 150.0000 mg | ORAL_TABLET | Freq: Once | ORAL | 0 refills | Status: AC

## 2016-01-20 NOTE — Telephone Encounter (Signed)
Rx sent 

## 2016-01-20 NOTE — Telephone Encounter (Signed)
Pt called and came in last week for an ear infection and was prescribed an antibiotic and states that she now has a yeast infection. Does she need to make an appointment to come in or can we call something in for her? Thank you!  Call pt  [phone]  Pharmacy - [id number] - [place], [place]

## 2016-01-27 ENCOUNTER — Ambulatory Visit (INDEPENDENT_AMBULATORY_CARE_PROVIDER_SITE_OTHER): Payer: Medicare Other | Admitting: Family Medicine

## 2016-01-27 ENCOUNTER — Encounter: Payer: Self-pay | Admitting: Family Medicine

## 2016-01-27 VITALS — BP 144/102 | HR 79 | Temp 98.5°F | Wt 168.2 lb

## 2016-01-27 DIAGNOSIS — I749 Embolism and thrombosis of unspecified artery: Secondary | ICD-10-CM

## 2016-01-27 DIAGNOSIS — Z23 Encounter for immunization: Secondary | ICD-10-CM | POA: Insufficient documentation

## 2016-01-27 DIAGNOSIS — H6121 Impacted cerumen, right ear: Secondary | ICD-10-CM | POA: Diagnosis not present

## 2016-01-27 DIAGNOSIS — IMO0002 Reserved for concepts with insufficient information to code with codable children: Secondary | ICD-10-CM

## 2016-01-27 NOTE — Patient Instructions (Signed)
Continue the Eliquis.  We will call with the imaging and with the referrals.  Take care  Dr. Lacinda Axon

## 2016-01-27 NOTE — Assessment & Plan Note (Signed)
Irrigated today without resolution. Sending to ENT.

## 2016-01-27 NOTE — Progress Notes (Signed)
Pre visit review using our clinic review tool, if applicable. No additional management support is needed unless otherwise documented below in the visit note. 

## 2016-01-27 NOTE — Assessment & Plan Note (Signed)
Patient doing well at this time. We had a long extensive conversation about anticoagulation and discontinuation. Patient very reluctant and hesitant. As a result, I advised her that she should see hematology for their expert opinion. Also, arranging CT angiogram as well as venous Dopplers to ensure resolution/lack of recurrence given the severity of her prior embolus.

## 2016-01-27 NOTE — Progress Notes (Addendum)
Subjective:  Patient ID: Peggy Cuevas, female    DOB: 12-03-1945  Age: 70 y.o. MRN: [id number]  CC: R ear pain, Discuss anticoagulation  HPI:  70 year old female with hypertension, history of breast cancer, pulmonary embolus, iron deficiency anemia presents with the above complaints.  Ear pain  Ongoing issue  Patient was recently seen for this on 9/29. She was found to have otitis media and was placed on antibiotic.  She's continued to have some pain. She cannot hear as well.  No associated fevers or chills.  No known exacerbating factors.  She's been using the Debrox without improvement.  PE  Has been 6 months since patient's PE.  Patient is very reluctant to discontinue anticoagulation. However, she is also worried about continuation given ongoing iron deficiency anemia.  We have discussed this essentially in the past and she would like to revisit this today.  She is currently feeling well. No shortness of breath. Tolerating liquids without difficulty.  Social Hx   Social History   Social History  . Marital status: Married    Spouse name: N/A  . Number of children: 2  . Years of education: N/A   Occupational History  . retired Pharmacist, hospital    Social History Main Topics  . Smoking status: Never Smoker  . Smokeless tobacco: Never Used  . Alcohol use Yes     Comment: 07/29/2015 "might have a drink 4-6 times/year"  . Drug use: No  . Sexual activity: Yes   Other Topics Concern  . None   Social History Narrative  . None   Review of Systems  Constitutional: Negative.   HENT: Positive for ear pain and hearing loss.   Respiratory: Negative for shortness of breath.    Objective:  BP (!) 144/102 (BP Location: Right Arm, Patient Position: Sitting, Cuff Size: Normal)   Pulse 79   Temp 98.5 F (36.9 C) (Oral)   Wt 168 lb 4 oz (76.3 kg)   SpO2 94%   BMI 29.34 kg/m   BP/Weight 01/27/2016 [id number]  Systolic BP 123456 A999333 123456  Diastolic BP A999333 98 [id number]    Wt. (Lbs) 168.25 169.2 164.5  BMI 29.34 29.5 28.68   Physical Exam  Constitutional: She is oriented to person, place, and time. She appears well-developed. No distress.  HENT:  Right TM obscured by cerumen.  Pulmonary/Chest: Effort normal.  Neurological: She is alert and oriented to person, place, and time.  Psychiatric: She has a normal mood and affect.  Vitals reviewed.  Lab Results  Component Value Date   WBC 7.3 10/22/2015   HGB 11.5 (L) 10/22/2015   HCT 35.1 (L) 10/22/2015   PLT 304.0 10/22/2015   GLUCOSE 86 08/10/2015   CHOL 227 (H) 10/13/2015   TRIG 182.0 (H) 10/13/2015   HDL 50.40 10/13/2015   LDLDIRECT 133.0 08/10/2015   LDLCALC 140 (H) 10/13/2015   ALT 15 08/10/2015   AST 18 08/10/2015   NA 137 08/10/2015   K 4.4 08/10/2015   CL 102 08/10/2015   CREATININE 0.75 08/10/2015   BUN 16 08/10/2015   CO2 29 08/10/2015   INR 1.28 07/28/2015    Assessment & Plan:   Problem List Items Addressed This Visit    Encounter for immunization   Relevant Orders   Flu vaccine HIGH DOSE PF (Completed)   Impacted cerumen of right ear - Primary    Irrigated today without resolution. Sending to ENT.      Relevant Orders   Ambulatory referral to  ENT   Saddle embolus [facility])    Patient doing well at this time. We had a long extensive conversation about anticoagulation and discontinuation. Patient very reluctant and hesitant. As a result, I advised her that she should see hematology for their expert opinion. Also, arranging CT angiogram as well as venous Dopplers to ensure resolution/lack of recurrence given the severity of her prior embolus.      Relevant Orders   Ambulatory referral to Hematology   CT Angio Chest W/Cm &/Or Wo Cm   US Venous Img Lower Bilateral    Other Visit Diagnoses   None.    Follow-up: Following imaging and Heme visit  30 minutes were spent face-to-face with the patient during this encounter and over half of that time was spent on counseling  regarding anticoagulation (when to discontinue, referral to heme, repeat imaging; and risk/benefits of discontinuation).   [place]

## 2016-01-28 ENCOUNTER — Other Ambulatory Visit: Payer: Self-pay | Admitting: Family Medicine

## 2016-01-28 DIAGNOSIS — I2692 Saddle embolus of pulmonary artery without acute cor pulmonale: Secondary | ICD-10-CM

## 2016-02-01 ENCOUNTER — Encounter: Payer: Self-pay | Admitting: Family Medicine

## 2016-02-03 ENCOUNTER — Ambulatory Visit
Admission: RE | Admit: 2016-02-03 | Discharge: 2016-02-03 | Disposition: A | Discharge status: Home / Self Care | Payer: Medicare Other | Source: Ambulatory Visit | Attending: Family Medicine | Admitting: Family Medicine

## 2016-02-03 DIAGNOSIS — I82432 Acute embolism and thrombosis of left popliteal vein: Secondary | ICD-10-CM | POA: Insufficient documentation

## 2016-02-03 DIAGNOSIS — K449 Diaphragmatic hernia without obstruction or gangrene: Secondary | ICD-10-CM | POA: Diagnosis not present

## 2016-02-03 DIAGNOSIS — I749 Embolism and thrombosis of unspecified artery: Secondary | ICD-10-CM | POA: Diagnosis present

## 2016-02-03 DIAGNOSIS — I700 Atherosclerosis of aorta: Secondary | ICD-10-CM | POA: Insufficient documentation

## 2016-02-03 DIAGNOSIS — Z86718 Personal history of other venous thrombosis and embolism: Secondary | ICD-10-CM | POA: Insufficient documentation

## 2016-02-03 DIAGNOSIS — IMO0002 Reserved for concepts with insufficient information to code with codable children: Secondary | ICD-10-CM

## 2016-02-03 LAB — POCT I-STAT CREATININE: Creatinine, Ser: 0.7 mg/dL (ref 0.44–1.00)

## 2016-02-03 MED ORDER — IOPAMIDOL (ISOVUE-370) INJECTION 76%
75.0000 mL | Freq: Once | INTRAVENOUS | Status: AC | PRN
  Administered 2016-02-03: 75 mL via INTRAVENOUS

## 2016-02-04 ENCOUNTER — Telehealth: Payer: Self-pay | Admitting: *Deleted

## 2016-02-04 ENCOUNTER — Telehealth: Payer: Self-pay

## 2016-02-04 NOTE — Telephone Encounter (Signed)
-----   Message from Elias Else, [place] sent at 11/24/2015  2:59 PM EDT -----   ----- Message ----- From: Elias Else, CMA Sent: 11/24/2015   2:18 PM To: Elias Else, CMA  Pt needs a colon/egd scheduled 2 months from office visit 11-24-2015/ Eliquis pt Peggy Cuevas

## 2016-02-04 NOTE — Telephone Encounter (Signed)
Pt given results. She may callback with a decision in regards to her starting cholesterol medication. Once patient calls please document her answer and send to me.

## 2016-02-04 NOTE — Telephone Encounter (Signed)
contacted pt. She is still on her blood thinner (Eliquis). She has been seen by Dr Lacinda Axon and he is sending her to Hematology. However her appointment is not until 03-2016. She is still not sure if the blood thinner is going to be stopped yet. She had a US Venous done on 02-03-16 and is awaiting results

## 2016-02-04 NOTE — Telephone Encounter (Signed)
Ok

## 2016-02-04 NOTE — Telephone Encounter (Signed)
Pt requested a call in regards to her test results Pt contact [phone]

## 2016-02-05 NOTE — Telephone Encounter (Signed)
Please request a decision from Dr Lacinda Axon on whether this patient can be off Eliquis 2 days prior and up to 2 days after endoscopic procedures.  If so, then please schedule at next available. If not, then it will have to wait until hematology evaluation.  But it would be helpful to call Hematology and try to get an earlier appointment since I am not comfortable waiting another 2-3 months to do her procedures.

## 2016-02-05 NOTE — Telephone Encounter (Signed)
Called pt but was unable to leave voicemail due to it not being set up yet. Have pt give the questions she has about medication.

## 2016-02-05 NOTE — Telephone Encounter (Signed)
Pt called back and stated that she is okay with medication for cholesterol.

## 2016-02-05 NOTE — Telephone Encounter (Signed)
Pt stated that she has a couple of questions in regards to cholesterol medication.  Pt contact [phone]

## 2016-02-06 ENCOUNTER — Other Ambulatory Visit: Payer: Self-pay | Admitting: Family Medicine

## 2016-02-06 MED ORDER — ATORVASTATIN CALCIUM 10 MG PO TABS: 10.0000 mg | ORAL_TABLET | Freq: Every day | ORAL | 3 refills | Status: AC

## 2016-02-09 NOTE — Telephone Encounter (Signed)
Clot noted in popliteal vein despite 6 months of anticoagulation. She needs to see hematology. I do not feel comfortable stopping at this point in time. She was given an appointment in December apparently. I will have my referral coordinator expedite this.

## 2016-02-10 ENCOUNTER — Ambulatory Visit: Payer: Medicare Other | Admitting: Family Medicine

## 2016-02-11 ENCOUNTER — Telehealth: Payer: Self-pay | Admitting: Family Medicine

## 2016-02-11 NOTE — Telephone Encounter (Signed)
FYI

## 2016-02-11 NOTE — Telephone Encounter (Signed)
Peggy Cuevas from [facility] called and just wanted to let you know that they schedule pt for Saddle embolus with Dr. Grayland Ormond on 11/7 @ 3pm.

## 2016-02-15 ENCOUNTER — Telehealth: Payer: Self-pay | Admitting: Family Medicine

## 2016-02-15 NOTE — Telephone Encounter (Signed)
Pt called and wanted to know why she was being referred to Oncology. Thank you!  Call pt @ [phone]

## 2016-02-15 NOTE — Telephone Encounter (Signed)
Its hematology/oncology. This is regarding her PE/anticoagulation.

## 2016-02-15 NOTE — Telephone Encounter (Signed)
Is this for pt possible saddle embolus?

## 2016-02-16 NOTE — Telephone Encounter (Signed)
Spoke with pt and explained why she was referred to Dr. Grayland Ormond. I will call and cancel the appt with dr. Marletta Lor @ [facility] per her request.

## 2016-02-16 NOTE — Telephone Encounter (Signed)
Noted. We will wait for hematology evaluation before setting an appt for EGD/colonoscopy so we can get an opinion on the [facility]

## 2016-02-16 NOTE — Telephone Encounter (Signed)
We have arranged her to see someone locally sooner.

## 2016-02-16 NOTE — Telephone Encounter (Signed)
Pt stated has an appointment at [facility] on 04/12/16 and got a secondary phone call to schedule with another provider. Id there any reason why another office would call her?

## 2016-02-17 NOTE — Telephone Encounter (Signed)
Phone voicemail box is full. Will try again later

## 2016-02-21 ENCOUNTER — Other Ambulatory Visit: Payer: Self-pay | Admitting: Family Medicine

## 2016-02-21 NOTE — Progress Notes (Signed)
Peggy Cuevas  [phone]) [phone] [phone]) [phone]  ID: Peggy Cuevas OB: [birth year]  MR#: [id number]  [id number]  Patient Care Team: Coral Spikes, DO as PCP - General (Family Medicine)  CHIEF COMPLAINT: Saddle pulmonary embolus  INTERVAL HISTORY: Patient is 70 year old female who had acute onset of chest pain and shortness of breath in April 2017. Subsequent workup revealed lower extremity DVT as well as a saddle pulmonary embolus. She was placed on anticoagulation and is referred now for further evaluation and discussion of length of treatment. She currently feels well and is asymptomatic. She has no further chest pain or shortness of breath. She denies any recent fevers or illnesses. She has good appetite and denies weight loss. She has no neurologic complaints. She denies any nausea, vomiting, cons patient, or diarrhea. She has no urinary complaint. Patient feels at her baseline and offers no specific complaints today.  REVIEW OF SYSTEMS:   Review of Systems  Constitutional: Negative.  Negative for fever, malaise/fatigue and weight loss.  HENT: Negative.   Respiratory: Negative.  Negative for cough and shortness of breath.   Cardiovascular: Negative.  Negative for chest pain and leg swelling.  Gastrointestinal: Negative.  Negative for abdominal pain, blood in stool and melena.  Genitourinary: Negative.   Neurological: Negative.  Negative for weakness.  Endo/Heme/Allergies: Does not bruise/bleed easily.  Psychiatric/Behavioral: Negative.  The patient is not nervous/anxious.     As per HPI. Otherwise, a complete review of systems is negative.  PAST MEDICAL HISTORY: Past Medical History:  Diagnosis Date  . Anemia   . Cancer of left breast ([place]) 2007  . Hypertension   . Malignant neoplasm ([place]) 2007   left  . 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 BREAST BIOPSY  Left 2007  . BREAST LUMPECTOMY Left 06/29/05  . TONSILLECTOMY  1954  . TUBAL LIGATION  1974    FAMILY HISTORY: Family History  Problem Relation Age of Onset  . Heart disease Mother     CHF  . Breast cancer Paternal Aunt     ADVANCED DIRECTIVES (Y/N):  N  HEALTH MAINTENANCE: Social History  Substance Use Topics  . Smoking status: Never Smoker  . Smokeless tobacco: Never Used  . Alcohol use Yes     Comment: 07/29/2015 "might have a drink 4-6 times/year"     Colonoscopy:  PAP:  Bone density:  Lipid panel:  Allergies  Allergen Reactions  . Benazepril Hcl   . Oxytrol For Women [Oxybutynin]   . Taxol [Paclitaxel]     Current Outpatient Prescriptions  Medication Sig Dispense Refill  . apixaban (ELIQUIS) 5 MG TABS tablet Take 1 tablet (5 mg total) by mouth 2 (two) times daily. 180 tablet 0  . atorvastatin (LIPITOR) 10 MG tablet Take 1 tablet (10 mg total) by mouth daily. 90 tablet 3  . CALCIUM PO Take 1 tablet by mouth daily.    . Cholecalciferol (VITAMIN D-3 PO) Take 1 tablet by mouth daily.     . ferrous sulfate 325 (65 FE) MG tablet TAKE 1 TABLET (325 MG TOTAL) BY MOUTH 2 (TWO) TIMES DAILY WITH A MEAL. 60 tablet 3  . losartan (COZAAR) 100 MG tablet Take 100 mg by mouth daily.     No current facility-administered medications for this visit.     OBJECTIVE: Vitals:   02/23/16 1454  BP: (!) 154/109  Pulse: 90  Resp: 18  Temp: 98.4 F (  36.9 C)     Body mass index is 29.48 kg/m.    ECOG FS:0 - Asymptomatic  General: Well-developed, well-nourished, no acute distress. Eyes: Pink conjunctiva, anicteric sclera. HEENT: Normocephalic, moist mucous membranes, clear oropharnyx. Lungs: Clear to auscultation bilaterally. Heart: Regular rate and rhythm. No rubs, murmurs, or gallops. Abdomen: Soft, nontender, nondistended. No organomegaly noted, normoactive bowel sounds. Musculoskeletal: No edema, cyanosis, or clubbing. Neuro: Alert, answering all questions appropriately.  Cranial nerves grossly intact. Skin: No rashes or petechiae noted. Psych: Normal affect. Lymphatics: No cervical, calvicular, axillary or inguinal LAD.   LAB RESULTS:  Lab Results  Component Value Date   NA 137 08/10/2015   K 4.4 08/10/2015   CL 102 08/10/2015   CO2 29 08/10/2015   GLUCOSE 86 08/10/2015   BUN 16 08/10/2015   CREATININE 0.70 02/03/2016   CALCIUM 10.2 08/10/2015   PROT 7.4 08/10/2015   ALBUMIN 3.7 08/10/2015   AST 18 08/10/2015   ALT 15 08/10/2015   ALKPHOS 86 08/10/2015   BILITOT 0.4 08/10/2015   GFRNONAA >60 07/29/2015   GFRAA >60 07/29/2015    Lab Results  Component Value Date   WBC 7.3 10/22/2015   NEUTROABS 5.1 07/28/2015   HGB 11.5 (L) 10/22/2015   HCT 35.1 (L) 10/22/2015   MCV 81.1 10/22/2015   PLT 304.0 10/22/2015     STUDIES: Ct Angio Chest W/cm &/or Wo Cm  Result Date: 02/03/2016 CLINICAL DATA:  Pulmonary embolus. EXAM: CT ANGIOGRAPHY CHEST WITH CONTRAST TECHNIQUE: Multidetector CT imaging of the chest was performed using the standard protocol during bolus administration of intravenous contrast. Multiplanar CT image reconstructions and MIPs were obtained to evaluate the vascular anatomy. CONTRAST:  75 cc Isovue 370 COMPARISON:  CT scan dated 07/28/2015 FINDINGS: Cardiovascular: Normal. Complete resolution of the extensive bilateral pulmonary emboli. Right heart strain has resolved. Persistent mild cardiomegaly. Aortic atherosclerosis. Mediastinum/Nodes: Normal. Lungs/Pleura: Normal. Upper Abdomen: Large hiatal hernia, chronic. Musculoskeletal: No significant abnormality. Review of the MIP images confirms the above findings. IMPRESSION: 1. Complete resolution of the extensive bilateral pulmonary emboli seen on the prior study of 07/28/2015. 2. Aortic atherosclerosis. 3. Large hiatal hernia, chronic. 4. No acute abnormalities. Electronically Signed   By: Lorriane Shire M.D.   On: 02/03/2016 14:08   US Venous Img Lower Bilateral  Result Date:  02/03/2016 CLINICAL DATA:  Prior DVT and pulmonary embolus. EXAM: BILATERAL LOWER EXTREMITY VENOUS DOPPLER ULTRASOUND TECHNIQUE: Gray-scale sonography with graded compression, as well as color Doppler and duplex ultrasound were performed to evaluate the lower extremity deep venous systems from the level of the common femoral vein and including the common femoral, femoral, profunda femoral, popliteal and calf veins including the posterior tibial, peroneal and gastrocnemius veins when visible. The superficial great saphenous vein was also interrogated. Spectral Doppler was utilized to evaluate flow at rest and with distal augmentation maneuvers in the common femoral, femoral and popliteal veins. COMPARISON:  CT 02/03/2016.  CT 07/28/2015 FINDINGS: RIGHT LOWER EXTREMITY Common Femoral Vein: No evidence of thrombus. Normal compressibility, respiratory phasicity and response to augmentation. Saphenofemoral Junction: No evidence of thrombus. Normal compressibility and flow on color Doppler imaging. Profunda Femoral Vein: No evidence of thrombus. Normal compressibility and flow on color Doppler imaging. Femoral Vein: No evidence of thrombus. Normal compressibility, respiratory phasicity and response to augmentation. Popliteal Vein: No evidence of thrombus. Normal compressibility, respiratory phasicity and response to augmentation. Calf Veins: No evidence of thrombus. Normal compressibility and flow on color Doppler imaging. Superficial Great Saphenous Vein: No  evidence of thrombus. Normal compressibility and flow on color Doppler imaging. Other Findings:  None. LEFT LOWER EXTREMITY Common Femoral Vein: No evidence of thrombus. Normal compressibility, respiratory phasicity. Saphenofemoral Junction: No evidence of thrombus. Normal compressibility and flow on color Doppler imaging. Profunda Femoral Vein: No evidence of thrombus. Normal compressibility and flow on color Doppler imaging. Femoral Vein: No evidence of thrombus.  Normal compressibility, respiratory phasicity. Popliteal Vein: Small amount of nonocclusive thrombus in the lower portion of the left popliteal vein . Calf Veins: No evidence of thrombus. Normal compressibility and flow on color Doppler imaging. Superficial Great Saphenous Vein: No evidence of thrombus. Normal compressibility and flow on color Doppler imaging. Other Findings:  None. IMPRESSION: Small amount of nonocclusive thrombus noted in the lower portion of the left popliteal vein. Exam otherwise negative. Electronically Signed   By: Marcello Moores  Register   On: 02/03/2016 15:44    ASSESSMENT: Saddle pulmonary embolus  PLAN:    1. Saddle pulmonary embolus: Patient initially was diagnosed in April 2017 likely secondary to transient risk factor of extended travel. She has been taking Eliquis ever since. Repeat CT scan chest as above with no evidence of PE. Repeat ultrasound of lower extremity did reveal a small amount of nonocclusive thrombus in the left popliteal vein. This is possibly a chronic clot. Patient's entire hypercoagulable workup is negative. Her DRVVT is mildly elevated, but this is likely secondary to taking Eliquis. Patient has been instructed to complete her 6 months of anticoagulation and then discontinue. She expressed understanding that although her hypercoagulable workup is negative, she is at increased risk for DVT over the general population. Risk factors and prophylactic measures she can take were discussed. No further intervention is needed. Patient does not require additional follow up. Please refer her back if there are any questions or concerns.    Approximately 45 minutes was spent in discussion of which greater than 50% was consultation.   Patient expressed understanding and was in agreement with this plan. She also understands that She can call clinic at any time with any questions, concerns, or complaints.   Lloyd Huger, MD   02/29/2016 9:36 AM

## 2016-02-22 ENCOUNTER — Other Ambulatory Visit: Payer: Self-pay

## 2016-02-23 ENCOUNTER — Inpatient Hospital Stay: Payer: Medicare Other | Attending: Oncology | Admitting: Oncology

## 2016-02-23 ENCOUNTER — Inpatient Hospital Stay: Payer: Medicare Other

## 2016-02-23 ENCOUNTER — Encounter: Payer: Self-pay | Admitting: Oncology

## 2016-02-23 VITALS — BP 154/109 | HR 90 | Temp 98.4°F | Resp 18 | Wt 169.1 lb

## 2016-02-23 DIAGNOSIS — Z79899 Other long term (current) drug therapy: Secondary | ICD-10-CM | POA: Insufficient documentation

## 2016-02-23 DIAGNOSIS — IMO0002 Reserved for concepts with insufficient information to code with codable children: Secondary | ICD-10-CM

## 2016-02-23 DIAGNOSIS — I119 Hypertensive heart disease without heart failure: Secondary | ICD-10-CM | POA: Diagnosis not present

## 2016-02-23 DIAGNOSIS — Z853 Personal history of malignant neoplasm of breast: Secondary | ICD-10-CM | POA: Diagnosis not present

## 2016-02-23 DIAGNOSIS — Z86711 Personal history of pulmonary embolism: Secondary | ICD-10-CM | POA: Insufficient documentation

## 2016-02-23 DIAGNOSIS — Z7901 Long term (current) use of anticoagulants: Secondary | ICD-10-CM | POA: Diagnosis not present

## 2016-02-23 DIAGNOSIS — Z86718 Personal history of other venous thrombosis and embolism: Secondary | ICD-10-CM | POA: Diagnosis not present

## 2016-02-23 NOTE — Telephone Encounter (Signed)
Refilled 10/2015. Pt last seen 01/27/16. Please advise?

## 2016-02-23 NOTE — Progress Notes (Signed)
Pt here for second opinion regarding anticoagulation. Offers no complaints. Pt recently sold house and is anticipating moving to [place] as well.

## 2016-02-24 ENCOUNTER — Other Ambulatory Visit: Payer: Self-pay

## 2016-02-24 LAB — LUPUS ANTICOAGULANT PANEL
DRVVT: 50.3 s — ABNORMAL HIGH (ref 0.0–47.0)
PTT LA: 30.5 s (ref 0.0–51.9)

## 2016-02-24 LAB — PROTEIN S, TOTAL: Protein S Ag, Total: 121 % (ref 60–150)

## 2016-02-24 LAB — PROTEIN C ACTIVITY: Protein C Activity: 146 % (ref 73–180)

## 2016-02-24 LAB — CARDIOLIPIN ANTIBODIES, IGG, IGM, IGA: Anticardiolipin IgG: <9 GPL U/mL (ref 0–14)

## 2016-02-24 LAB — ANTITHROMBIN III: ANTITHROMB III FUNC: 109 % (ref 75–120)

## 2016-02-24 LAB — HOMOCYSTEINE: Homocysteine: 10.7 umol/L (ref 0.0–15.0)

## 2016-02-24 LAB — DRVVT MIX: dRVVT Mix: 44.4 s (ref 0.0–47.0)

## 2016-02-24 LAB — PROTEIN S ACTIVITY: Protein S Activity: 115 % (ref 63–140)

## 2016-02-24 NOTE — Telephone Encounter (Signed)
Mailbox is still full. No contact letter mailed.

## 2016-02-25 LAB — BETA-2-GLYCOPROTEIN I ABS, IGG/M/A

## 2016-02-25 LAB — PROTEIN C, TOTAL: Protein C, Total: 112 % (ref 60–150)

## 2016-02-26 LAB — PROTHROMBIN GENE MUTATION

## 2016-02-29 ENCOUNTER — Telehealth: Payer: Self-pay | Admitting: Gastroenterology

## 2016-02-29 LAB — FACTOR 5 LEIDEN

## 2016-02-29 NOTE — Telephone Encounter (Signed)
Patient says thank you for the letter. But she is not ready to schedule her Colonoscopy at this time. Wants to wait until next year. Will call back when she is off the Blood Thinners.

## 2016-03-01 NOTE — Telephone Encounter (Signed)
Please see other phone note for complete details.

## 2016-03-01 NOTE — Telephone Encounter (Signed)
I will proceed when she feels ready.  I would like to mention that I read the 11/7 office note from Dr Grayland Ormond of Hematology, where he recommends stopping the eliquis at 6 months from the original PE.  Since the PE occurred in April, 6 months has now passed.  Perhaps there was more to their discussion than recorded in his office note, and maybe she is continuing the anti-coagulation for more time.  So I will stay out of that decision and be available when she is ready to proceed with EGD and colonoscopy. Happy holidays

## 2016-03-21 ENCOUNTER — Telehealth: Payer: Self-pay | Admitting: Family Medicine

## 2016-03-21 NOTE — Telephone Encounter (Signed)
Pt was seen by Dr.Finnegan 02/23/16 he recommended that she stop eliquis due to it being 6 months from PE. Pt was never told a confirmation of this from Dr.Finnegan's office a telephone note written by Dr.Danis but nothing confirming pt to stop. She would like your opinion. She has sold her house and will be moving to [place] in the next week or so. She saw commercials not to go off eliquis without talking to a doctor.

## 2016-03-21 NOTE — Telephone Encounter (Signed)
Dr. Gary Fleet note says to stop.

## 2016-03-21 NOTE — Telephone Encounter (Signed)
LVM for pt to callback or call office of provider who gave medication.

## 2016-03-21 NOTE — Telephone Encounter (Signed)
Pt called back and was told to call Dr.Finnegan's office but per is note he wanted her to stop. Dr.Cook was consulted and stated pt can stop.

## 2017-01-31 ENCOUNTER — Other Ambulatory Visit: Payer: Self-pay | Admitting: Family Medicine

## 2017-01-31 NOTE — Telephone Encounter (Signed)
Please contact the patient to get her set up for follow-up. Once she is set up I will consider refilling. Thanks.

## 2017-01-31 NOTE — Telephone Encounter (Signed)
Patient has not been seen in one year, no follow up scheduled.  Please advise, thanks

## 2017-02-10 NOTE — Telephone Encounter (Signed)
Patient should request refill from current PCP.

## 2017-02-10 NOTE — Telephone Encounter (Signed)
Patient states she has moved to [email or web address] and has established with a new doctor there

## 2018-06-15 IMAGING — CT CT ANGIO CHEST
1 of 3 series · 19 of 32 positions shown · IV contrast (isovue)
Comparison: CT scan dated 07/28/2015

CLINICAL DATA: Pulmonary embolus.

EXAM:
CT ANGIOGRAPHY CHEST WITH CONTRAST
TECHNIQUE: Multidetector CT imaging of the chest was performed using the
standard protocol during bolus administration of intravenous
contrast. Multiplanar CT image reconstructions and MIPs were
obtained to evaluate the vascular anatomy.
CONTRAST:  75 cc Isovue 370

[Series 6: thins · axial · 0.65mm/px · z∈[-672,-442]mm · 19 of 252 slices shown]
[im 11/252  lung]
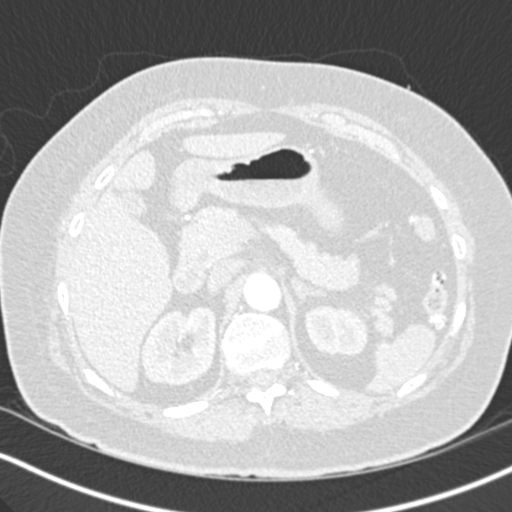
[im 22/252  soft-tissue]
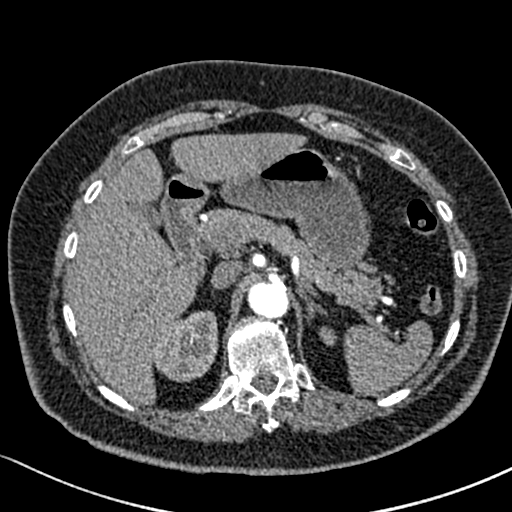
[im 33/252  lung]
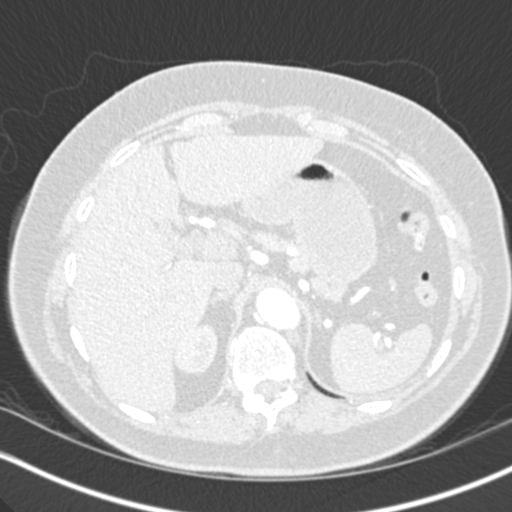
[im 55/252  soft-tissue]
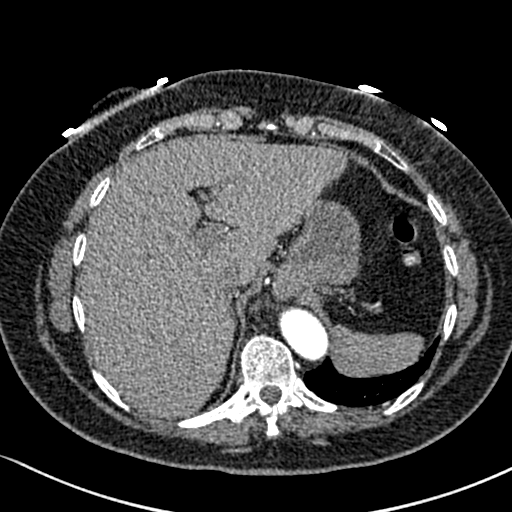
[im 66/252  lung]
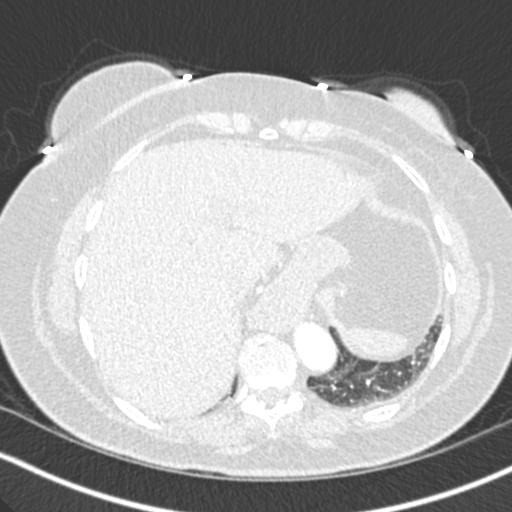
[im 77/252  soft-tissue]
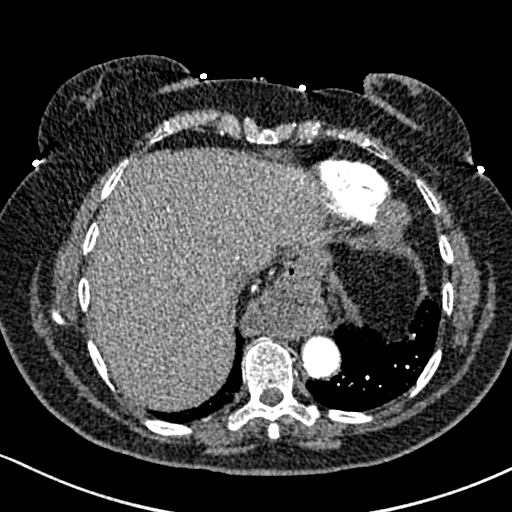
[im 88/252  lung]
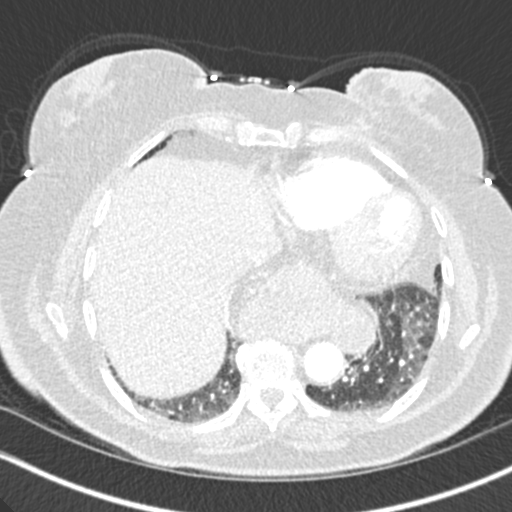
[im 99/252  soft-tissue]
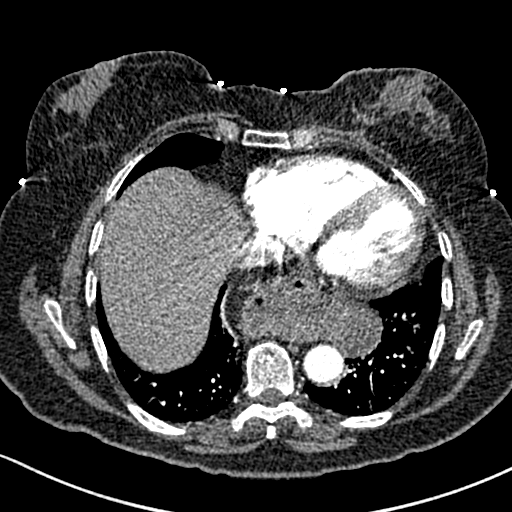
[im 110/252  lung]
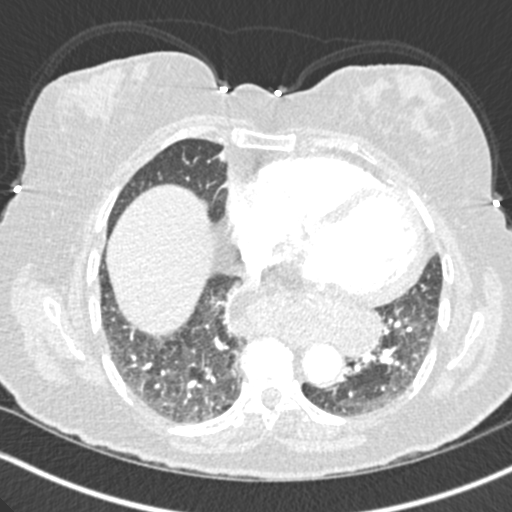
[im 131/252  soft-tissue]
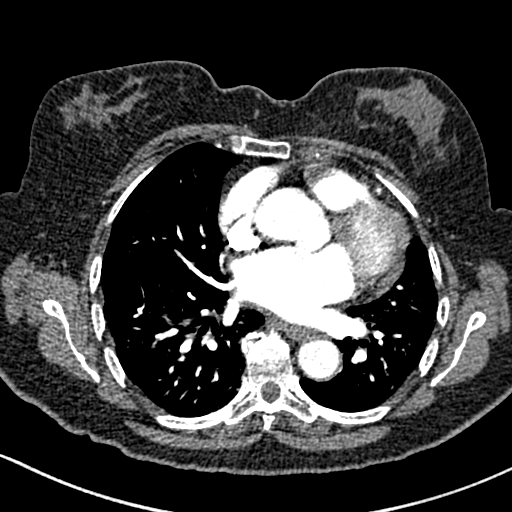
[im 142/252  lung]
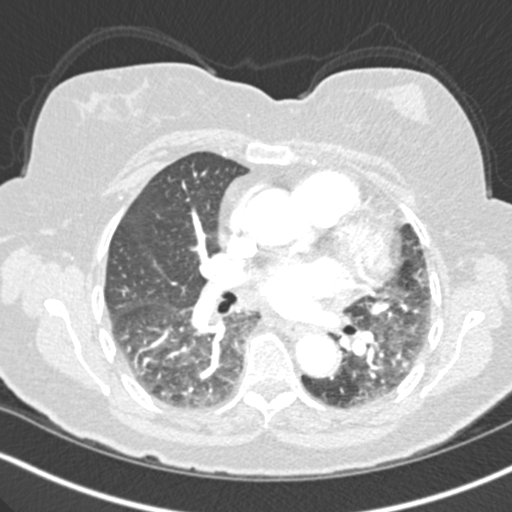
[im 153/252  soft-tissue]
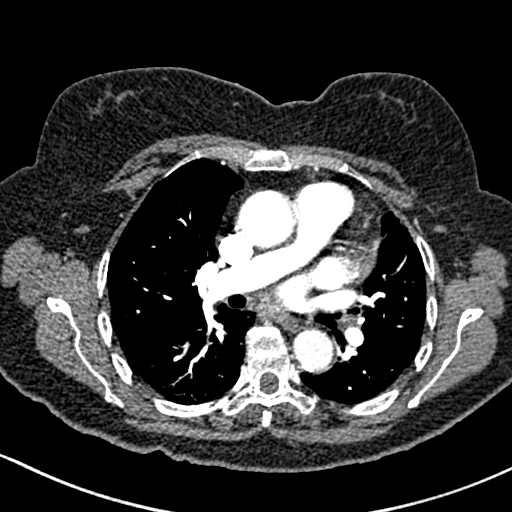
[im 164/252  lung]
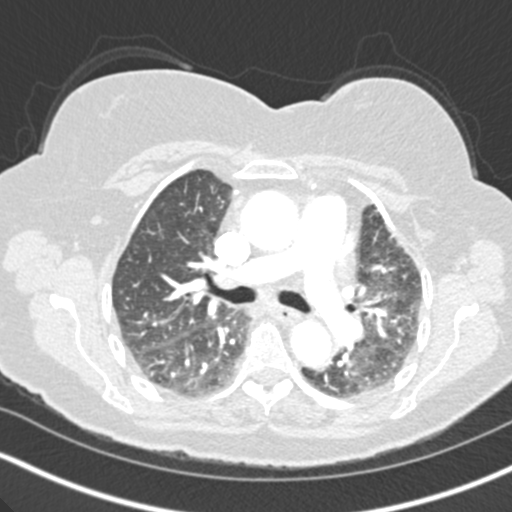
[im 175/252  soft-tissue]
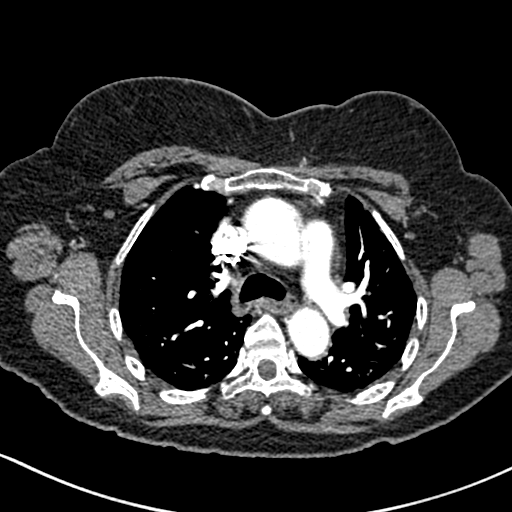
[im 186/252  lung]
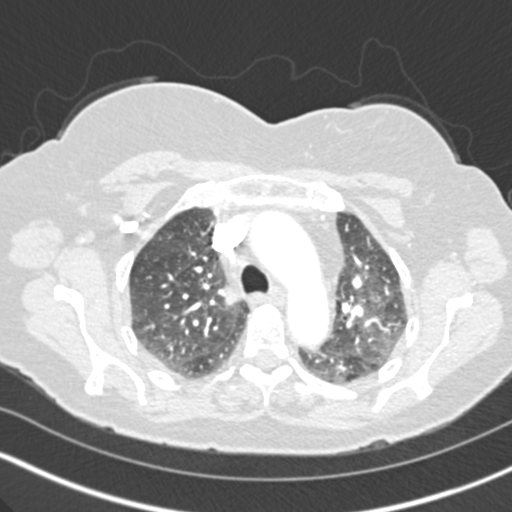
[im 197/252  soft-tissue]
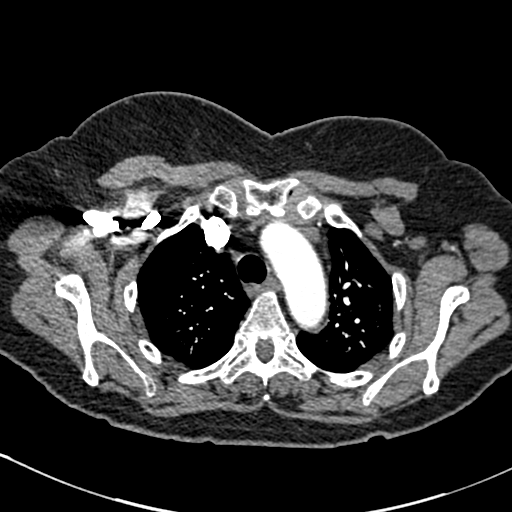
[im 219/252  lung]
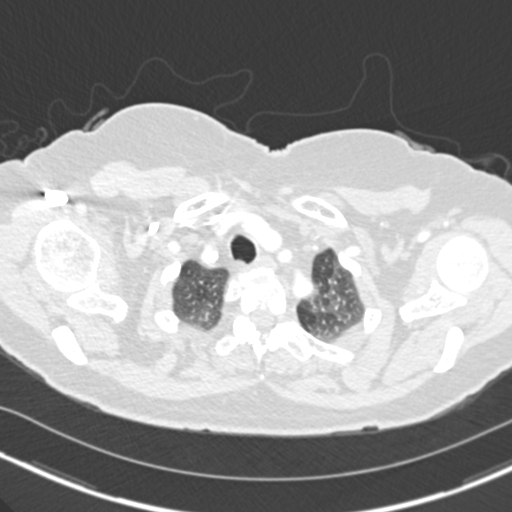
[im 230/252  soft-tissue]
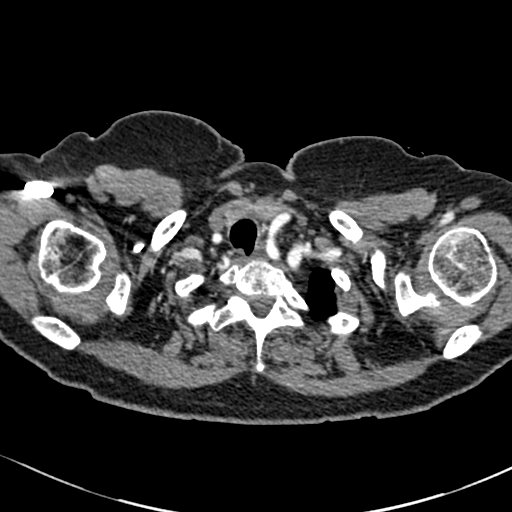
[im 241/252  lung]
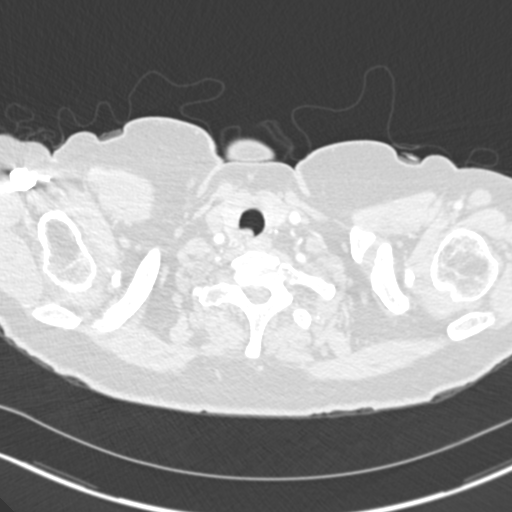

[19 of 32 positions shown; findings below may reference images not displayed]

FINDINGS: Cardiovascular: Normal. Complete resolution of the extensive
bilateral pulmonary emboli. Right heart strain has resolved.
Persistent mild cardiomegaly. Aortic atherosclerosis.

Mediastinum/Nodes: Normal.

Lungs/Pleura: Normal.

Upper Abdomen: Large hiatal hernia, chronic.

Musculoskeletal: No significant abnormality.

Review of the MIP images confirms the above findings.
IMPRESSION: 1. Complete resolution of the extensive bilateral pulmonary emboli
seen on the prior study of 07/28/2015.
2. Aortic atherosclerosis.
3. Large hiatal hernia, chronic.
4. No acute abnormalities.
